# Patient Record
Sex: Female | Born: 1965 | Race: Black or African American | Hispanic: Yes | Marital: Married | Smoking: Current every day smoker
Health system: Southern US, Community
[De-identification: ages and names within clinical notes are randomized; demographics above are authoritative.]

## PROBLEM LIST (undated history)

## (undated) DIAGNOSIS — I10 Essential (primary) hypertension: Secondary | ICD-10-CM

## (undated) DIAGNOSIS — K219 Gastro-esophageal reflux disease without esophagitis: Secondary | ICD-10-CM

## (undated) DIAGNOSIS — T7840XA Allergy, unspecified, initial encounter: Secondary | ICD-10-CM

## (undated) DIAGNOSIS — J45909 Unspecified asthma, uncomplicated: Secondary | ICD-10-CM

## (undated) DIAGNOSIS — F419 Anxiety disorder, unspecified: Secondary | ICD-10-CM

## (undated) HISTORY — DX: Anxiety disorder, unspecified: F41.9

## (undated) HISTORY — DX: Allergy, unspecified, initial encounter: T78.40XA

## (undated) HISTORY — PX: APPENDECTOMY: SHX54

## (undated) HISTORY — PX: KNEE SURGERY: SHX244

## (undated) HISTORY — PX: CHOLECYSTECTOMY: SHX55

## (undated) HISTORY — DX: Unspecified asthma, uncomplicated: J45.909

---

## 2001-10-26 ENCOUNTER — Encounter: Payer: Self-pay | Admitting: Physical Medicine & Rehabilitation

## 2001-10-26 ENCOUNTER — Encounter
Admission: RE | Admit: 2001-10-26 | Discharge: 2001-10-26 | Payer: Self-pay | Admitting: Physical Medicine & Rehabilitation

## 2002-10-04 ENCOUNTER — Encounter: Payer: Self-pay | Admitting: Physical Medicine & Rehabilitation

## 2002-10-04 ENCOUNTER — Encounter
Admission: RE | Admit: 2002-10-04 | Discharge: 2002-10-04 | Payer: Self-pay | Admitting: Physical Medicine & Rehabilitation

## 2003-03-27 ENCOUNTER — Encounter
Admission: RE | Admit: 2003-03-27 | Discharge: 2003-06-25 | Payer: Self-pay | Admitting: Physical Medicine & Rehabilitation

## 2006-08-11 ENCOUNTER — Ambulatory Visit: Payer: Self-pay | Admitting: *Deleted

## 2006-10-03 ENCOUNTER — Emergency Department: Payer: Self-pay | Admitting: Emergency Medicine

## 2006-11-27 ENCOUNTER — Emergency Department: Payer: Self-pay | Admitting: Emergency Medicine

## 2006-12-17 ENCOUNTER — Ambulatory Visit: Payer: Self-pay | Admitting: Family Medicine

## 2007-09-23 IMAGING — US ABDOMEN ULTRASOUND
1 series · 17 of 25 positions shown · non-contrast
Comparison: none

REASON FOR EXAM: RUQ abd pain suspects liver or gallbladder or right
upper bowel  eval for stones
COMMENTS:

[Series 1: abdomen ultrasound · 17 of 67 slices shown]
[im 1/67]
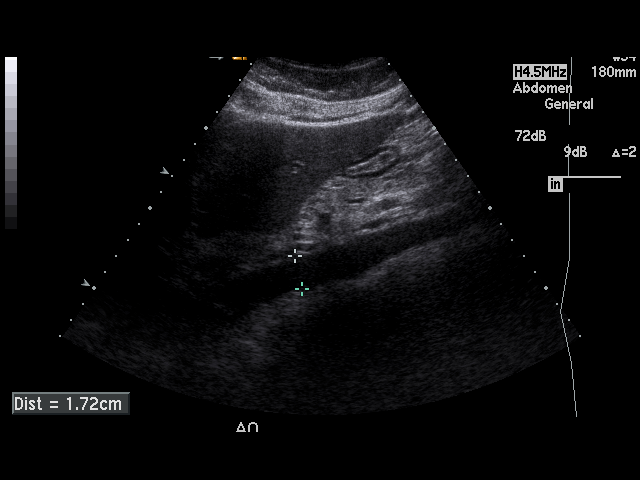
[im 6/67]
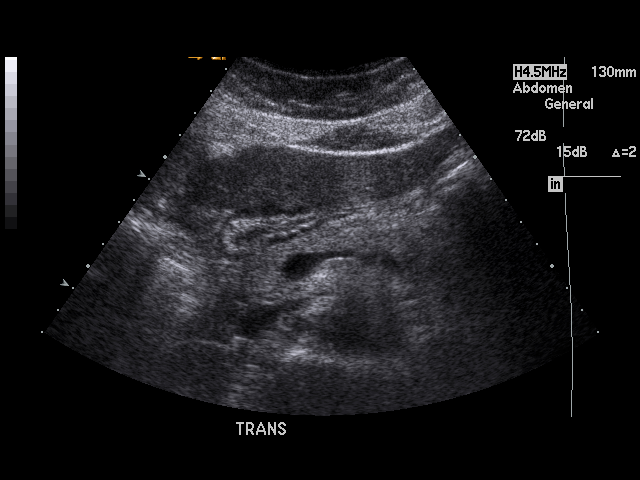
[im 9/67]
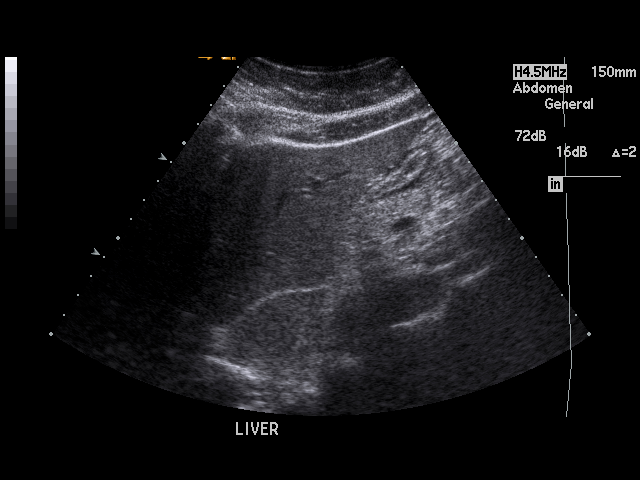
[im 14/67]
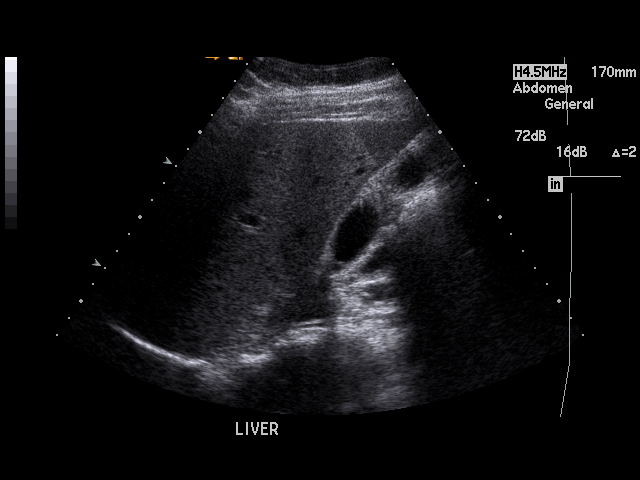
[im 17/67]
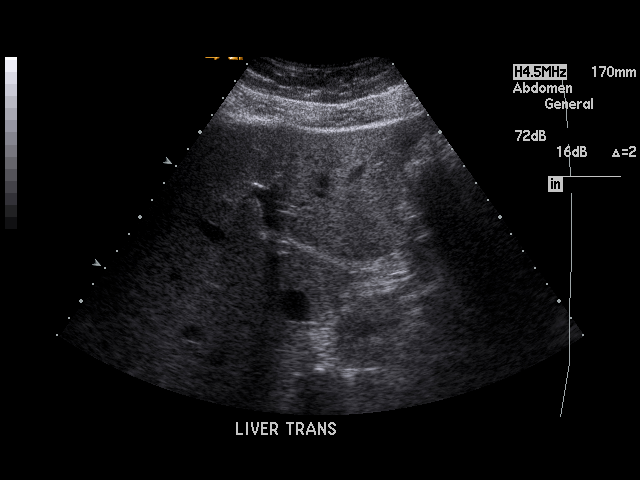
[im 23/67]
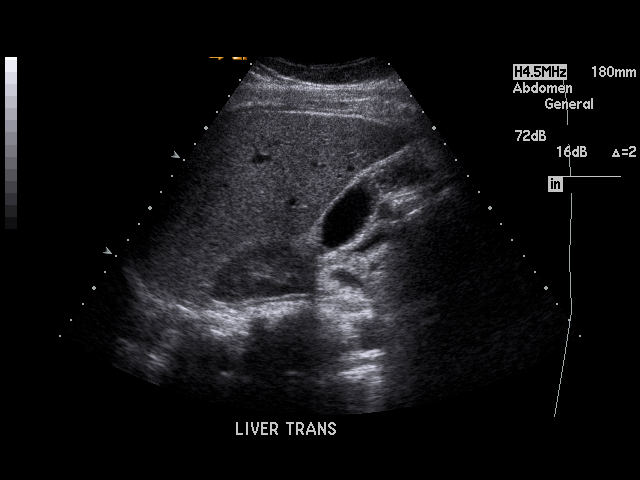
[im 25/67]
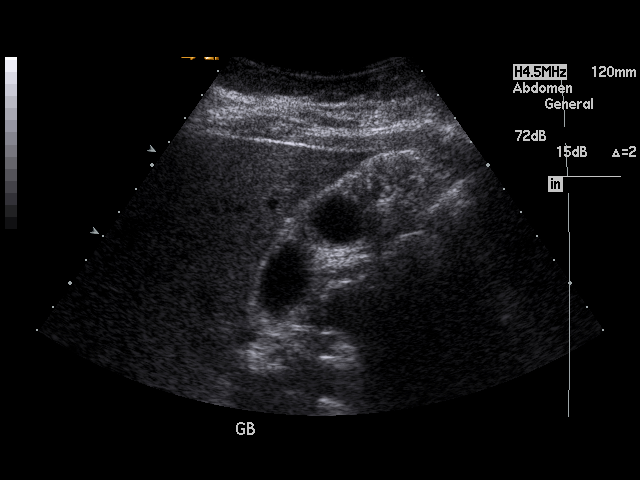
[im 31/67]
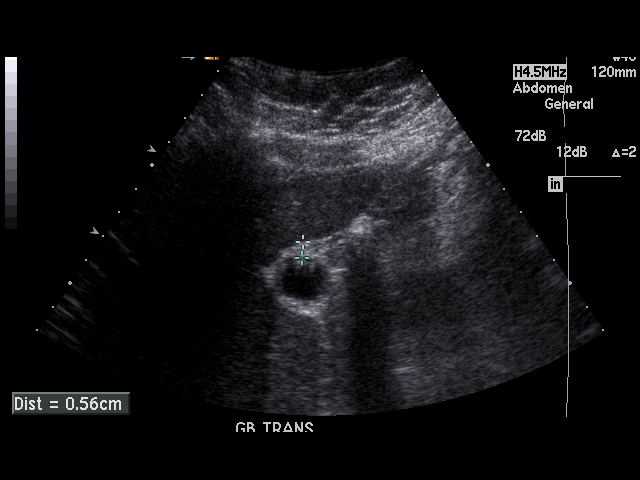
[im 34/67]
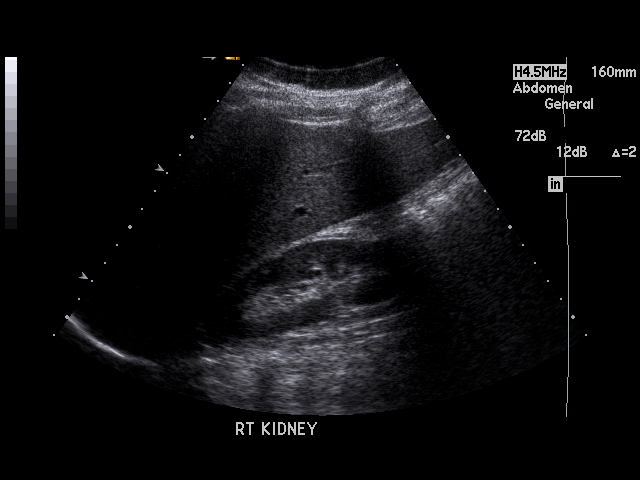
[im 36/67]
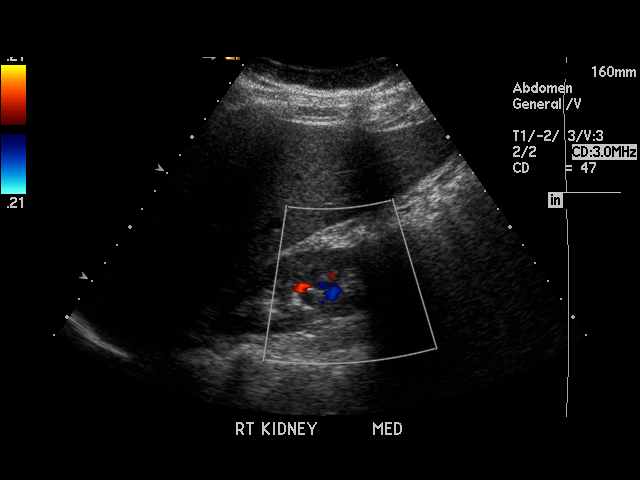
[im 42/67]
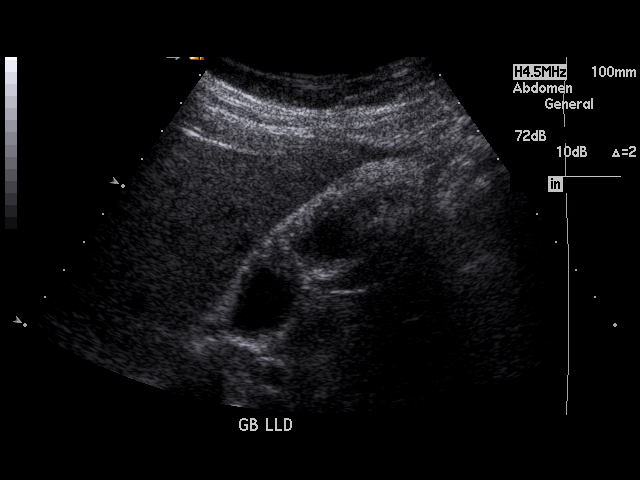
[im 45/67]
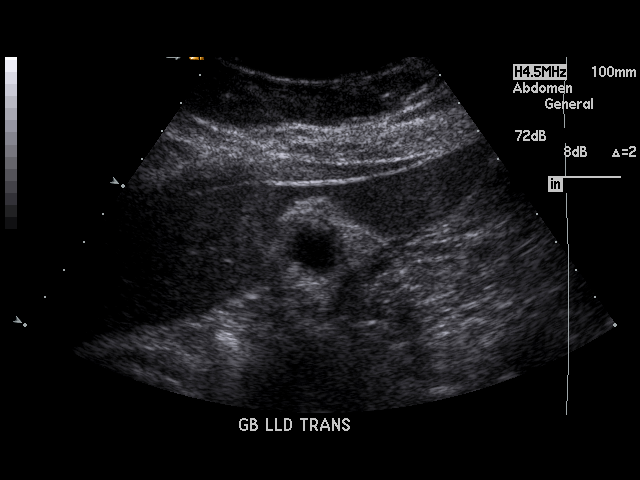
[im 50/67]
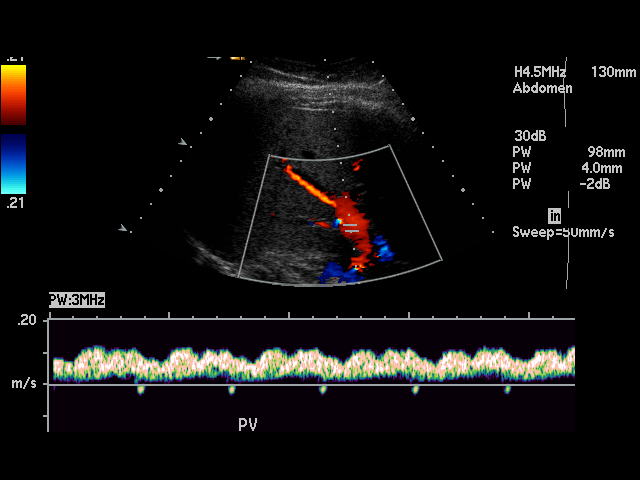
[im 53/67]
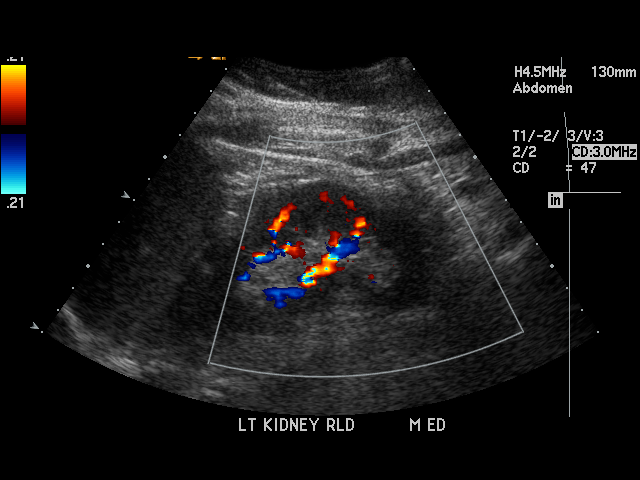
[im 58/67]
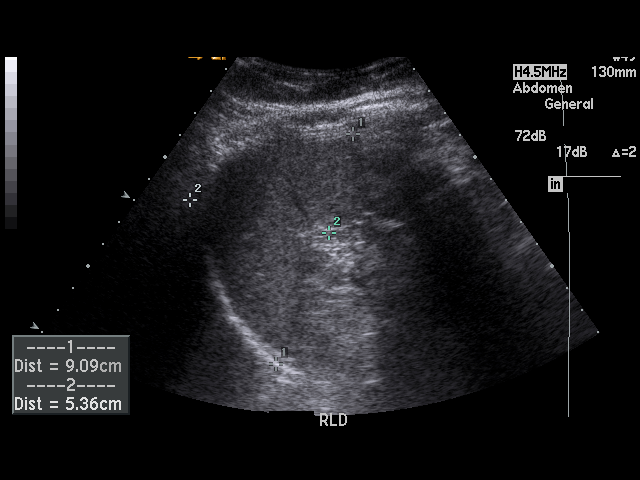
[im 61/67]
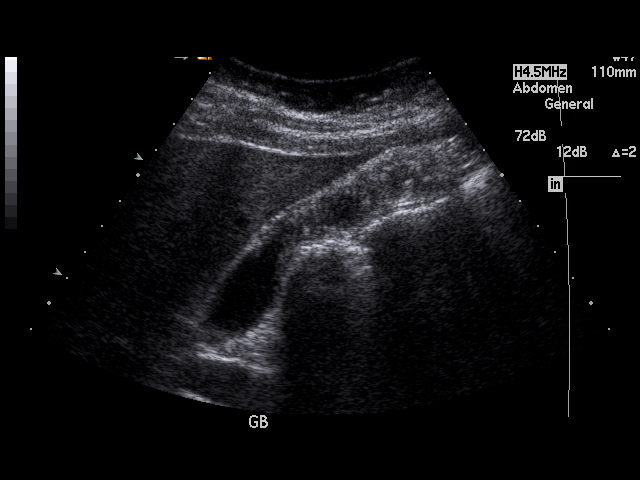
[im 67/67]
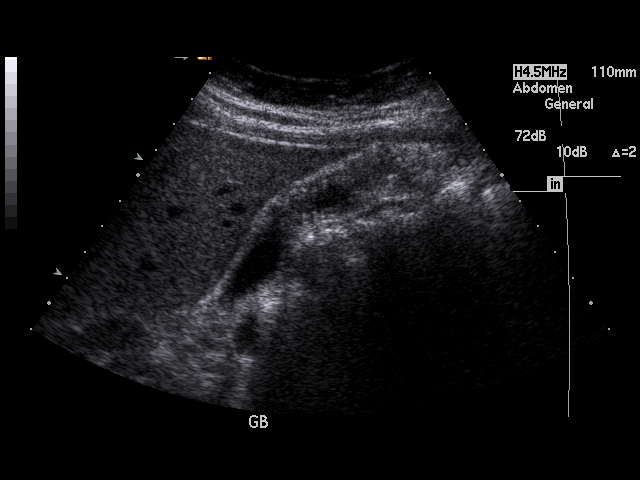

[17 of 25 positions shown; findings below may reference images not displayed]

PROCEDURE:     US  - US ABDOMEN GENERAL SURVEY  - December 17, 2006  [DATE]

RESULT:        Sonographic evaluation of the abdomen is performed in the
standard fashion.   The pancreas body is seen and appears to be normal.  The
head is partially visualized and appears to be unremarkable.   The
gallbladder shows an abnormal appearance.  No definite stones are
identified.  The gallbladder wall is abnormally thickened to 5.6 mm.  The
common bile duct diameter is distended to 7.8 mm.  The patient has a
positive sonographic Murphy sign.  The appearance is consistent with acute
cholecystitis.   The distention of the common bile duct can be consistent
with distal obstruction.  The kidneys, liver, aorta and spleen appear to be
normal.
IMPRESSION: Findings consistent with acute cholecystitis.   No gallstones identified.
A positive sonographic Murphy sign is present.  Surgical consultation is
recommended.

## 2008-09-26 ENCOUNTER — Ambulatory Visit: Payer: Self-pay | Admitting: Family Medicine

## 2009-03-20 ENCOUNTER — Emergency Department: Payer: Self-pay | Admitting: Emergency Medicine

## 2009-06-28 ENCOUNTER — Emergency Department: Payer: Self-pay | Admitting: Unknown Physician Specialty

## 2010-12-29 ENCOUNTER — Observation Stay: Payer: Self-pay | Admitting: Surgery

## 2011-01-01 LAB — PATHOLOGY REPORT

## 2014-07-19 DIAGNOSIS — G4700 Insomnia, unspecified: Secondary | ICD-10-CM | POA: Insufficient documentation

## 2014-10-16 DIAGNOSIS — Z720 Tobacco use: Secondary | ICD-10-CM | POA: Insufficient documentation

## 2015-06-03 ENCOUNTER — Encounter: Payer: Self-pay | Admitting: *Deleted

## 2015-06-03 ENCOUNTER — Emergency Department
Admission: EM | Admit: 2015-06-03 | Discharge: 2015-06-04 | Disposition: A | Discharge status: Home / Self Care | Payer: BC Managed Care – PPO | Attending: Emergency Medicine | Admitting: Emergency Medicine

## 2015-06-03 DIAGNOSIS — I10 Essential (primary) hypertension: Secondary | ICD-10-CM | POA: Insufficient documentation

## 2015-06-03 DIAGNOSIS — N390 Urinary tract infection, site not specified: Secondary | ICD-10-CM

## 2015-06-03 DIAGNOSIS — Z720 Tobacco use: Secondary | ICD-10-CM | POA: Diagnosis not present

## 2015-06-03 DIAGNOSIS — Z3202 Encounter for pregnancy test, result negative: Secondary | ICD-10-CM | POA: Insufficient documentation

## 2015-06-03 DIAGNOSIS — R109 Unspecified abdominal pain: Secondary | ICD-10-CM

## 2015-06-03 HISTORY — DX: Essential (primary) hypertension: I10

## 2015-06-03 HISTORY — DX: Gastro-esophageal reflux disease without esophagitis: K21.9

## 2015-06-03 LAB — COMPREHENSIVE METABOLIC PANEL
ALT: 23 U/L (ref 14–54)
AST: 22 U/L (ref 15–41)
Albumin: 4.6 g/dL (ref 3.5–5.0)
Alkaline Phosphatase: 86 U/L (ref 38–126)
Anion gap: 11 (ref 5–15)
BUN: 15 mg/dL (ref 6–20)
CO2: 24 mmol/L (ref 22–32)
Calcium: 9.6 mg/dL (ref 8.9–10.3)
Chloride: 105 mmol/L (ref 101–111)
Creatinine, Ser: 0.9 mg/dL (ref 0.44–1.00)
GFR calc Af Amer: >60 mL/min (ref >60)
GFR calc non Af Amer: >60 mL/min (ref >60)
GLUCOSE: 102 mg/dL — AB (ref 65–99)
POTASSIUM: 3.5 mmol/L (ref 3.5–5.1)
SODIUM: 140 mmol/L (ref 135–145)
TOTAL PROTEIN: 7.6 g/dL (ref 6.5–8.1)
Total Bilirubin: 0.4 mg/dL (ref 0.3–1.2)

## 2015-06-03 LAB — CBC WITH DIFFERENTIAL/PLATELET
BASOS ABS: 0.1 10*3/uL (ref 0–0.1)
BASOS PCT: 1 %
Eosinophils Absolute: 0.1 10*3/uL (ref 0–0.7)
Eosinophils Relative: 2 %
HCT: 37.2 % (ref 35.0–47.0)
Hemoglobin: 11.9 g/dL — ABNORMAL LOW (ref 12.0–16.0)
LYMPHS PCT: 30 %
Lymphs Abs: 2.8 10*3/uL (ref 1.0–3.6)
MCH: 27 pg (ref 26.0–34.0)
MCHC: 32 g/dL (ref 32.0–36.0)
MCV: 84.5 fL (ref 80.0–100.0)
MONO ABS: 0.6 10*3/uL (ref 0.2–0.9)
Monocytes Relative: 7 %
NEUTROS PCT: 60 %
Neutro Abs: 5.5 10*3/uL (ref 1.4–6.5)
PLATELETS: 321 10*3/uL (ref 150–440)
RBC: 4.4 MIL/uL (ref 3.80–5.20)
RDW: 15.2 % — ABNORMAL HIGH (ref 11.5–14.5)
WBC: 9.1 10*3/uL (ref 3.6–11.0)

## 2015-06-03 LAB — URINALYSIS COMPLETE WITH MICROSCOPIC (ARMC ONLY)
Bacteria, UA: NONE SEEN
Bilirubin Urine: NEGATIVE
Glucose, UA: NEGATIVE mg/dL
Ketones, ur: NEGATIVE mg/dL
Nitrite: NEGATIVE
Protein, ur: NEGATIVE mg/dL
Specific Gravity, Urine: 1.015 (ref 1.005–1.030)
pH: 5 (ref 5.0–8.0)

## 2015-06-03 LAB — POCT PREGNANCY, URINE: Preg Test, Ur: NEGATIVE

## 2015-06-03 LAB — LIPASE, BLOOD: Lipase: 64 U/L — ABNORMAL HIGH (ref 22–51)

## 2015-06-03 MED ORDER — MORPHINE SULFATE 4 MG/ML IJ SOLN
4.0000 mg | Freq: Once | INTRAMUSCULAR | Status: AC
  Administered 2015-06-03: 4 mg via INTRAVENOUS
  Filled 2015-06-03: qty 1

## 2015-06-03 MED ORDER — ONDANSETRON HCL 4 MG/2ML IJ SOLN
4.0000 mg | Freq: Once | INTRAMUSCULAR | Status: AC
  Administered 2015-06-03: 4 mg via INTRAVENOUS
  Filled 2015-06-03: qty 2

## 2015-06-03 MED ORDER — SODIUM CHLORIDE 0.9 % IV BOLUS (SEPSIS)
1000.0000 mL | Freq: Once | INTRAVENOUS | Status: AC
  Administered 2015-06-03: 1000 mL via INTRAVENOUS

## 2015-06-03 NOTE — ED Notes (Signed)
Pt reports left flank pain that began on Saturday, admits to hx of lower back pain however reports this feels different - denies any fever or urinary symptoms. Pt c/o nausea at present.

## 2015-06-03 NOTE — ED Notes (Signed)
Warm blanket given to pt per pt request

## 2015-06-03 NOTE — ED Provider Notes (Signed)
[facility] Emergency Department Provider Note  ____________________________________________  Time seen: Approximately 10:10 PM  I have reviewed the triage vital signs and the nursing notes.   HISTORY  Chief Complaint Flank Pain    HPI Joyce [name] is a 49 y.o. female with history of hypertension and GERD presents for evaluation of intermittent colicky left flank pain radiating into the left groin for the past 2-3 days, gradual onset, worsening. Current severity is 10 out of 10. No modifying factors. No nausea, vomiting, diarrhea, fevers or chills. No dysuria or hematuria. No history of malignancy, no bowel or bladder incontinence, no numbness or weakness in the legs, no fever, no history of IV drug use.   Past Medical History  Diagnosis Date  . Hypertension   . GERD (gastroesophageal reflux disease)     There are no active problems to display for this patient.   Past Surgical History  Procedure Laterality Date  . Cholecystectomy    . Appendectomy    . Cesarean section    . Knee surgery      Current Outpatient Rx  Name  Route  Sig  Dispense  Refill  . amitriptyline (ELAVIL) 50 MG tablet   Oral   Take 50 mg by mouth at bedtime.         Marland Kitchen. omeprazole (PRILOSEC OTC) 20 MG tablet   Oral   Take 20 mg by mouth daily.           Allergies Codeine; Darvon; Percocet; and Vicodin  History reviewed. No pertinent family history.  Social History History  Substance Use Topics  . Smoking status: Current Every Day Smoker -- 0.50 packs/day    Types: Cigarettes  . Smokeless tobacco: Not on file  . Alcohol Use: No    Review of Systems Constitutional: No fever/chills Eyes: No visual changes. ENT: No sore throat. Cardiovascular: Denies chest pain. Respiratory: Denies shortness of breath. Gastrointestinal: No abdominal pain.  No nausea, no vomiting.  No diarrhea.  No constipation. Genitourinary: Negative for dysuria. Musculoskeletal: Positive  for left flank pain Skin: Negative for rash. Neurological: Negative for headaches, focal weakness or numbness.  10-point ROS otherwise negative.  ____________________________________________   PHYSICAL EXAM:  VITAL SIGNS: ED Triage Vitals  Enc Vitals Group     BP 06/03/15 2157 169/79 mmHg     Pulse Rate 06/03/15 2157 80     Resp 06/03/15 2157 22     Temp 06/03/15 2157 98.2 F (36.8 C)     Temp Source 06/03/15 2157 Oral     SpO2 06/03/15 2157 100 %     Weight 06/03/15 2157 190 lb (86.183 kg)     Height 06/03/15 2157 5' 2.5" (1.588 m)     Head Cir --      Peak Flow --      Pain Score 06/03/15 2158 8     Pain Loc --      Pain Edu? --      Excl. in GC? --     Constitutional: Alert and oriented. Tearful, in distress secondary to pain. Eyes: Conjunctivae are normal. PERRL. EOMI. Head: Atraumatic. Nose: No congestion/rhinnorhea. Mouth/Throat: Mucous membranes are moist.  Oropharynx non-erythematous. Neck: No stridor.  Cardiovascular: Normal rate, regular rhythm. Grossly normal heart sounds.  Good peripheral circulation. Respiratory: Normal respiratory effort.  No retractions. Lungs CTAB. Gastrointestinal: Soft and nontender. No distention. No abdominal bruits. No CVA tenderness. Genitourinary: deferred Musculoskeletal: No lower extremity tenderness nor edema.  No joint effusions. Neurologic:  Normal speech and language. No gross focal neurologic deficits are appreciated. Speech is normal. No gait instability. Skin:  Skin is warm, dry and intact. No rash noted. Psychiatric: Mood and affect are normal. Speech and behavior are normal.  ____________________________________________   LABS (all labs ordered are listed, but only abnormal results are displayed)  Labs Reviewed  URINALYSIS COMPLETEWITH MICROSCOPIC ([facility] ONLY)  CBC WITH DIFFERENTIAL/PLATELET  COMPREHENSIVE METABOLIC PANEL  LIPASE, BLOOD  POC URINE PREG, ED  POCT PREGNANCY, URINE    ____________________________________________  EKG  none ____________________________________________  RADIOLOGY  none ____________________________________________   PROCEDURES  Procedure(s) performed: None  Critical Care performed: No  ____________________________________________   INITIAL IMPRESSION / ASSESSMENT AND PLAN / ED COURSE  Pertinent labs & imaging results that were available during my care of the patient were reviewed by me and considered in my medical decision making (see chart for details).  Joyce [name] is a 49 y.o. female with history of hypertension and GERD presents for evaluation of intermittent colicky left flank pain radiating into the left groin for the past 2-3 days, gradual onset, worsening. On arrival to the emergency department, she is tearful, in distress secondary to pain, unable to find a comfortable position on the bed. Vital signs stable, she is afebrile. Plan for screening labs, urinalysis to evaluate for evidence of kidney stone or urinary tract infection, IV fluids, pain control, antiemetics. Reassess for disposition and need for advanced imaging.  ----------------------------------------- 11:07 PM on 06/03/2015 -----------------------------------------  Care transferred to Dr. York Cerise pending workup, reassessments, disposition. ____________________________________________   FINAL CLINICAL IMPRESSION(S) / ED DIAGNOSES  Final diagnoses:  Left flank pain      Gayla Doss, MD 06/03/15 2308

## 2015-06-04 ENCOUNTER — Encounter: Payer: Self-pay | Admitting: Radiology

## 2015-06-04 ENCOUNTER — Emergency Department: Payer: BC Managed Care – PPO

## 2015-06-04 MED ORDER — IOHEXOL 240 MG/ML SOLN
25.0000 mL | Freq: Once | INTRAMUSCULAR | Status: AC | PRN
  Administered 2015-06-04: 25 mL via ORAL

## 2015-06-04 MED ORDER — CEPHALEXIN 500 MG PO CAPS: 500.0000 mg | ORAL_CAPSULE | Freq: Three times a day (TID) | ORAL | Status: DC

## 2015-06-04 MED ORDER — IOHEXOL 300 MG/ML  SOLN
100.0000 mL | Freq: Once | INTRAMUSCULAR | Status: AC | PRN
  Administered 2015-06-04: 100 mL via INTRAVENOUS

## 2015-06-04 MED ORDER — CEPHALEXIN 500 MG PO CAPS
500.0000 mg | ORAL_CAPSULE | Freq: Once | ORAL | Status: AC
  Administered 2015-06-04: 500 mg via ORAL
  Filled 2015-06-04: qty 1

## 2015-06-04 MED ORDER — MORPHINE SULFATE 4 MG/ML IJ SOLN
4.0000 mg | Freq: Once | INTRAMUSCULAR | Status: AC
  Administered 2015-06-04: 4 mg via INTRAVENOUS
  Filled 2015-06-04: qty 1

## 2015-06-04 MED ORDER — HYDROCODONE-ACETAMINOPHEN 5-325 MG PO TABS: 1.0000 | ORAL_TABLET | ORAL | Status: DC | PRN

## 2015-06-04 MED ORDER — KETOROLAC TROMETHAMINE 30 MG/ML IJ SOLN
30.0000 mg | Freq: Once | INTRAMUSCULAR | Status: AC
  Administered 2015-06-04: 30 mg via INTRAVENOUS
  Filled 2015-06-04: qty 1

## 2015-06-04 NOTE — Discharge Instructions (Signed)
As we discussed, we did not identify a specific cause for your flank pain, but we believe it may be related to a urinary tract infection.  Please take the prescribed antibiotics as written and follow-up with your regular doctor in 2 days.  Return to the emergency department if he develop new or worsening symptoms that concern you.  And then taking over-the-counter ibuprofen as instructed on the bottle as needed for pain.  Take Norco as prescribed for severe pain. Do not drink alcohol, drive or participate in any other potentially dangerous activities while taking this medication as it may make you sleepy. Do not take this medication with any other sedating medications, either prescription or over-the-counter. If you were prescribed Percocet or Vicodin, do not take these with acetaminophen (Tylenol) as it is already contained within these medications.   This medication is an opiate (or narcotic) pain medication and can be habit forming.  Use it as little as possible to achieve adequate pain control.  Do not use or use it with extreme caution if you have a history of opiate abuse or dependence.  If you are on a pain contract with your primary care doctor or a pain specialist, be sure to let them know you were prescribed this medication today from [facility].  This medication is intended for your use only - do not give any to anyone else and keep it in a secure place where nobody else, especially children, have access to it.  It will also cause or worsen constipation, so you may want to consider taking an over-the-counter stool softener while you are taking this medication.   Flank Pain Flank pain refers to pain that is located on the side of the body between the upper abdomen and the back. The pain may occur over a short period of time (acute) or may be long-term or reoccurring (chronic). It may be mild or severe. Flank pain can be caused by many things. CAUSES  Some of the  more common causes of flank pain include:  Muscle strains.   Muscle spasms.   A disease of your spine (vertebral disk disease).   A lung infection (pneumonia).   Fluid around your lungs (pulmonary edema).   A kidney infection.   Kidney stones.   A very painful skin rash caused by the chickenpox virus (shingles).   Gallbladder disease.  HOME CARE INSTRUCTIONS  Home care will depend on the cause of your pain. In general,  Rest as directed by your caregiver.  Drink enough fluids to keep your urine clear or pale yellow.  Only take over-the-counter or prescription medicines as directed by your caregiver. Some medicines may help relieve the pain.  Tell your caregiver about any changes in your pain.  Follow up with your caregiver as directed. SEEK IMMEDIATE MEDICAL CARE IF:   Your pain is not controlled with medicine.   You have new or worsening symptoms.  Your pain increases.   You have abdominal pain.   You have shortness of breath.   You have persistent nausea or vomiting.   You have swelling in your abdomen.   You feel faint or pass out.   You have blood in your urine.  You have a fever or persistent symptoms for more than 2-3 days.  You have a fever and your symptoms suddenly get worse. MAKE SURE YOU:   Understand these instructions.  Will watch your condition.  Will get help right away if you are not  doing well or get worse. Document Released: 12/31/2005 Document Revised: 08/03/2012 Document Reviewed: 06/23/2012 [facility] Patient Information 2015 [place], [place]. This information is not intended to replace advice given to you by your health care provider. Make sure you discuss any questions you have with your health care provider.  Urinary Tract Infection Urinary tract infections (UTIs) can develop anywhere along your urinary tract. Your urinary tract is your body's drainage system for removing wastes and extra water. Your urinary tract  includes two kidneys, two ureters, a bladder, and a urethra. Your kidneys are a pair of bean-shaped organs. Each kidney is about the size of your fist. They are located below your ribs, one on each side of your spine. CAUSES Infections are caused by microbes, which are microscopic organisms, including fungi, viruses, and bacteria. These organisms are so small that they can only be seen through a microscope. Bacteria are the microbes that most commonly cause UTIs. SYMPTOMS  Symptoms of UTIs may vary by age and gender of the patient and by the location of the infection. Symptoms in young women typically include a frequent and intense urge to urinate and a painful, burning feeling in the bladder or urethra during urination. Older women and men are more likely to be tired, shaky, and weak and have muscle aches and abdominal pain. A fever may mean the infection is in your kidneys. Other symptoms of a kidney infection include pain in your back or sides below the ribs, nausea, and vomiting. DIAGNOSIS To diagnose a UTI, your caregiver will ask you about your symptoms. Your caregiver also will ask to provide a urine sample. The urine sample will be tested for bacteria and white blood cells. White blood cells are made by your body to help fight infection. TREATMENT  Typically, UTIs can be treated with medication. Because most UTIs are caused by a bacterial infection, they usually can be treated with the use of antibiotics. The choice of antibiotic and length of treatment depend on your symptoms and the type of bacteria causing your infection. HOME CARE INSTRUCTIONS  If you were prescribed antibiotics, take them exactly as your caregiver instructs you. Finish the medication even if you feel better after you have only taken some of the medication.  Drink enough water and fluids to keep your urine clear or pale yellow.  Avoid caffeine, tea, and carbonated beverages. They tend to irritate your bladder.  Empty  your bladder often. Avoid holding urine for long periods of time.  Empty your bladder before and after sexual intercourse.  After a bowel movement, women should cleanse from front to back. Use each tissue only once. SEEK MEDICAL CARE IF:   You have back pain.  You develop a fever.  Your symptoms do not begin to resolve within 3 days. SEEK IMMEDIATE MEDICAL CARE IF:   You have severe back pain or lower abdominal pain.  You develop chills.  You have nausea or vomiting.  You have continued burning or discomfort with urination. MAKE SURE YOU:   Understand these instructions.  Will watch your condition.  Will get help right away if you are not doing well or get worse. Document Released: 08/19/2005 Document Revised: 05/10/2012 Document Reviewed: 12/18/2011 [facility] Patient Information 2015 [place], [place]. This information is not intended to replace advice given to you by your health care provider. Make sure you discuss any questions you have with your health care provider.

## 2015-06-04 NOTE — ED Provider Notes (Signed)
-----------------------------------------   11:00 PM on 06/03/2015 -----------------------------------------  Assuming care from Dr. Inocencio HomesGayle.  In short, Joyce [name] is a 49 y.o. female with a chief complaint of Flank Pain .  Refer to the original H&P for additional details.  The current plan of care is to follow-up the urinalysis and pursue additional workup of the flank pain as needed.  ----------------------------------------- 12:09 AM on 06/04/2015 -----------------------------------------   The urinalysis was generally unremarkable; the few white cells seen is unlikely to represent a significant pyelonephritis, and there is no significant hematuria.  As a result, I will proceed with Dr. Joya SalmGayle's plan for a contrast CT abdomen and pelvis to further evaluate the source of the patient's pain.  I am treating with morphine, Zofran, and Toradol.  ----------------------------------------- 1:37 AM on 06/04/2015 -----------------------------------------  Ct Abdomen Pelvis W Contrast  06/04/2015   CLINICAL DATA:  Intermittent colicky left flank pain radiating to the left groin for 3 days. Gradual onset and worsening.  EXAM: CT ABDOMEN AND PELVIS WITH CONTRAST  TECHNIQUE: Multidetector CT imaging of the abdomen and pelvis was performed using the standard protocol following bolus administration of intravenous contrast.  CONTRAST:  100mL OMNIPAQUE IOHEXOL 300 MG/ML SOLN, 25mL OMNIPAQUE IOHEXOL 240 MG/ML SOLN  COMPARISON:  None.  FINDINGS: Dependent atelectasis in the lung bases. Calcifications in the paraesophageal region are likely due to granulomas.  Diffuse fatty infiltration of the liver. Surgical absence of the gallbladder. No bile duct dilatation. The pancreas, spleen, kidneys, abdominal aorta, inferior vena cava, and retroperitoneal lymph nodes are unremarkable. Minimal left adrenal gland nodule measuring 10 mm diameter. Appearance is nonspecific but statistically most likely to represent a small  adenoma. Stomach, small bowel, and colon are not abnormally distended. No free air or free fluid in the abdomen. Abdominal wall musculature appears intact.  Pelvis: Appendix is surgically absent. Uterus and ovaries are not enlarged. Small amount of free fluid in the pelvis is likely physiologic. Bladder wall is not thickened. No pelvic mass or lymphadenopathy. Sigmoid colon is unremarkable. No hydroureter. Mild degenerative changes in the spine. No destructive bone lesions.  IMPRESSION: Diffuse fatty infiltration of the liver. 1 cm left adrenal gland nodule is likely an adenoma. Small amount of free fluid in the pelvis is likely physiologic. No acute process otherwise identified.   Electronically Signed   By: Burman NievesWilliam  Stevens M.D.   On: 06/04/2015 01:19   CT results are unremarkable as are her labs other than a possible mild UTI.  I reevaluated the patient in person and she states that her pain is much better now, and she is lying in the bed in no acute distress watching TV.  I gave her the results and explained that we do not have a definitive diagnosis but it may be related to a urinary tract infection.  I discussed with her my plan to treat her with Keflex and recommended that she follow up in 2 days with her primary care doctor or return immediately to the emergency department should her symptoms worsen.  She understands and agrees with the plan.  New Prescriptions   CEPHALEXIN (KEFLEX) 500 MG CAPSULE    Take 1 capsule (500 mg total) by mouth 3 (three) times daily.   HYDROCODONE-ACETAMINOPHEN (NORCO/VICODIN) 5-325 MG PER TABLET    Take 1-2 tablets by mouth every 4 (four) hours as needed for moderate pain.      Loleta Roseory Fatoumata Albaugh, MD 06/04/15 [phone]

## 2015-06-06 LAB — URINE CULTURE: SPECIAL REQUESTS: NORMAL

## 2015-06-12 DIAGNOSIS — K219 Gastro-esophageal reflux disease without esophagitis: Secondary | ICD-10-CM | POA: Insufficient documentation

## 2015-09-13 DIAGNOSIS — J4521 Mild intermittent asthma with (acute) exacerbation: Secondary | ICD-10-CM | POA: Insufficient documentation

## 2015-09-26 ENCOUNTER — Encounter: Payer: Self-pay | Admitting: Emergency Medicine

## 2015-09-26 ENCOUNTER — Emergency Department
Admission: EM | Admit: 2015-09-26 | Discharge: 2015-09-26 | Disposition: A | Discharge status: Home / Self Care | Payer: BC Managed Care – PPO | Attending: Student | Admitting: Student

## 2015-09-26 DIAGNOSIS — S29012A Strain of muscle and tendon of back wall of thorax, initial encounter: Secondary | ICD-10-CM | POA: Insufficient documentation

## 2015-09-26 DIAGNOSIS — X58XXXA Exposure to other specified factors, initial encounter: Secondary | ICD-10-CM | POA: Insufficient documentation

## 2015-09-26 DIAGNOSIS — Y998 Other external cause status: Secondary | ICD-10-CM | POA: Diagnosis not present

## 2015-09-26 DIAGNOSIS — I10 Essential (primary) hypertension: Secondary | ICD-10-CM | POA: Diagnosis not present

## 2015-09-26 DIAGNOSIS — Y9289 Other specified places as the place of occurrence of the external cause: Secondary | ICD-10-CM | POA: Diagnosis not present

## 2015-09-26 DIAGNOSIS — Z720 Tobacco use: Secondary | ICD-10-CM | POA: Diagnosis not present

## 2015-09-26 DIAGNOSIS — S29019A Strain of muscle and tendon of unspecified wall of thorax, initial encounter: Secondary | ICD-10-CM

## 2015-09-26 DIAGNOSIS — Y9389 Activity, other specified: Secondary | ICD-10-CM | POA: Insufficient documentation

## 2015-09-26 DIAGNOSIS — Z792 Long term (current) use of antibiotics: Secondary | ICD-10-CM | POA: Diagnosis not present

## 2015-09-26 DIAGNOSIS — Z79899 Other long term (current) drug therapy: Secondary | ICD-10-CM | POA: Insufficient documentation

## 2015-09-26 DIAGNOSIS — S299XXA Unspecified injury of thorax, initial encounter: Secondary | ICD-10-CM | POA: Diagnosis present

## 2015-09-26 MED ORDER — TRAMADOL HCL 50 MG PO TABS: 50.0000 mg | ORAL_TABLET | Freq: Four times a day (QID) | ORAL | Status: DC | PRN

## 2015-09-26 MED ORDER — TRAMADOL HCL 50 MG PO TABS
50.0000 mg | ORAL_TABLET | Freq: Once | ORAL | Status: AC
  Administered 2015-09-26: 50 mg via ORAL
  Filled 2015-09-26: qty 1

## 2015-09-26 MED ORDER — ORPHENADRINE CITRATE 30 MG/ML IJ SOLN
60.0000 mg | Freq: Two times a day (BID) | INTRAMUSCULAR | Status: DC
  Administered 2015-09-26: 60 mg via INTRAMUSCULAR
  Filled 2015-09-26: qty 2

## 2015-09-26 MED ORDER — NAPROXEN 500 MG PO TABS: 500.0000 mg | ORAL_TABLET | Freq: Two times a day (BID) | ORAL | Status: DC

## 2015-09-26 MED ORDER — METHOCARBAMOL 750 MG PO TABS: 1500.0000 mg | ORAL_TABLET | Freq: Four times a day (QID) | ORAL | Status: DC

## 2015-09-26 MED ORDER — KETOROLAC TROMETHAMINE 60 MG/2ML IM SOLN
60.0000 mg | Freq: Once | INTRAMUSCULAR | Status: AC
  Administered 2015-09-26: 60 mg via INTRAMUSCULAR
  Filled 2015-09-26: qty 2

## 2015-09-26 NOTE — ED Notes (Signed)
Pt states she was having spasms last night and now the pain and spasms has gotten worse pt states this has never happened before states no injury. Pt describes pain as more flank pain pt states she had her urine tested on tues

## 2015-09-26 NOTE — ED Notes (Signed)
C/o right mid back pain, radiates to right chest and across back.  Pain worse when moving right arm.  No SOB/ DOE.  Skin warm and dry.

## 2015-09-26 NOTE — ED Notes (Signed)
ED provider at bedside for eval

## 2015-09-26 NOTE — Discharge Instructions (Signed)
Thoracic Strain Thoracic strain is an injury to the muscles or tendons that attach to the upper back. A strain can be mild or severe. A mild strain may take only 1-2 weeks to heal. A severe strain involves torn muscles or tendons, so it may take 6-8 weeks to heal. HOME CARE  Rest as needed. Limit your activity as told by your doctor.  If directed, put ice on the injured area:  Put ice in a plastic bag.  Place a towel between your skin and the bag.  Leave the ice on for 20 minutes, 2-3 times per day.  Take over-the-counter and prescription medicines only as told by your doctor.  Begin doing exercises as told by your doctor or physical therapist.  Warm up before being active.  Bend your knees before you lift heavy objects.  Keep all follow-up visits as told by your doctor. This is important. GET HELP IF:  Your pain is not helped by medicine.  Your pain, bruising, or swelling is getting worse.  You have a fever. GET HELP RIGHT AWAY IF:  You have shortness of breath.  You have chest pain.  You have weakness or loss of feeling (numbness) in your legs.  You cannot control when you pee (urinate).   This information is not intended to replace advice given to you by your health care provider. Make sure you discuss any questions you have with your health care provider.   Document Released: 04/27/2008 Document Revised: 07/31/2015 Document Reviewed: 01/03/2015 [employer] Interactive Patient Education [employer]2016 [employer]

## 2015-09-26 NOTE — ED Provider Notes (Signed)
[facility] Emergency Department Provider Note  ____________________________________________  Time seen: Approximately 10:42 PM  I have reviewed the triage vital signs and the nursing notes.   HISTORY  Chief Complaint Back Pain    HPI Zonya Gudger is a 49 y.o. female 2 days of upper back pain. Patient described pain as spasmodic. Patient states she went to see her PCP 2 days ago did a urinalysis showed a she denies having any urinary tract infection or hematuria. Patient states the pain has increased a radiates across her upper thoracic area from right to left. Patient denies any loss of sensation of the upper extremities. Patient is rating her pain as a 9/10. No palliative measures for this complaint.Patient states no provocative incident causing this complaint.   Past Medical History  Diagnosis Date  . Hypertension   . GERD (gastroesophageal reflux disease)     There are no active problems to display for this patient.   Past Surgical History  Procedure Laterality Date  . Cholecystectomy    . Appendectomy    . Cesarean section    . Knee surgery      Current Outpatient Rx  Name  Route  Sig  Dispense  Refill  . amitriptyline (ELAVIL) 50 MG tablet   Oral   Take 50 mg by mouth at bedtime.         . cephALEXin (KEFLEX) 500 MG capsule   Oral   Take 1 capsule (500 mg total) by mouth 3 (three) times daily.   36 capsule   0   . HYDROcodone-acetaminophen (NORCO/VICODIN) 5-325 MG per tablet   Oral   Take 1-2 tablets by mouth every 4 (four) hours as needed for moderate pain.   15 tablet   0   . omeprazole (PRILOSEC OTC) 20 MG tablet   Oral   Take 20 mg by mouth daily.           Allergies Codeine; Darvon; Percocet; and Vicodin  No family history on file.  Social History Social History  Substance Use Topics  . Smoking status: Current Every Day Smoker -- 0.50 packs/day    Types: Cigarettes  . Smokeless tobacco: None  . Alcohol  Use: No    Review of Systems Constitutional: No fever/chills Eyes: No visual changes. ENT: No sore throat. Cardiovascular: Denies chest pain. Respiratory: Denies shortness of breath. Gastrointestinal: No abdominal pain.  No nausea, no vomiting.  No diarrhea.  No constipation. Genitourinary: Negative for dysuria. Musculoskeletal: Positive for thoracic pain Skin: Negative for rash. Neurological: Negative for headaches, focal weakness or numbness. Endocrine:Hypertension Hematological/Lymphatic: Allergic/Immunilogical: See medication list  10-point ROS otherwise negative.  ____________________________________________   PHYSICAL EXAM:  VITAL SIGNS: ED Triage Vitals  Enc Vitals Group     BP 09/26/15 2151 158/64 mmHg     Pulse Rate 09/26/15 2151 78     Resp 09/26/15 2151 16     Temp 09/26/15 2151 98.5 F (36.9 C)     Temp Source 09/26/15 2151 Oral     SpO2 09/26/15 2151 95 %     Weight 09/26/15 2151 184 lb (83.462 kg)     Height 09/26/15 2151  (1.6 m)     Head Cir --      Peak Flow --      Pain Score 09/26/15 2151 9     Pain Loc --      Pain Edu? --      Excl. in GC? --     Constitutional:  Alert and oriented. Well appearing and in no acute distress. Eyes: Conjunctivae are normal. PERRL. EOMI. Head: Atraumatic. Nose: No congestion/rhinnorhea. Mouth/Throat: Mucous membranes are moist.  Oropharynx non-erythematous. Neck: No stridor.  No cervical spine tenderness to palpation. Hematological/Lymphatic/Immunilogical: No cervical lymphadenopathy. Cardiovascular: Normal rate, regular rhythm. Grossly normal heart sounds.  Good peripheral circulation. Respiratory: Normal respiratory effort.  No retractions. Lungs CTAB. Gastrointestinal: Soft and nontender. No distention. No abdominal bruits. No CVA tenderness. Musculoskeletal: No thoracic or deformity. Tender palpation inferior scapulary muscles bilaterally. Patient has full and equal range of motion of the upper  extremities.  Neurologic:  Normal speech and language. No gross focal neurologic deficits are appreciated. No gait instability. Skin:  Skin is warm, dry and intact. No rash noted. Psychiatric: Mood and affect are normal. Speech and behavior are normal.  ____________________________________________   LABS (all labs ordered are listed, but only abnormal results are displayed)  Labs Reviewed - No data to display ____________________________________________  EKG   ____________________________________________  RADIOLOGY   ____________________________________________   PROCEDURES  Procedure(s) performed: None  Critical Care performed: No  ____________________________________________   INITIAL IMPRESSION / ASSESSMENT AND PLAN / ED COURSE  Pertinent labs & imaging results that were available during my care of the patient were reviewed by me and considered in my medical decision making (see chart for details).  Thoracic strain. Patient given Norflex, Toradol, and tramadol in the ER. Patient discharged with prescription for Robaxin, naproxen, and tramadol. Patient advised to follow-up with PCP if condition persists. ____________________________________________   FINAL CLINICAL IMPRESSION(S) / ED DIAGNOSES  Final diagnoses:  Thoracic myofascial strain, initial encounter      Joni ReiningRonald K Carah Barrientes, PA-C 09/26/15 2254  Gayla DossEryka A Gayle, MD 09/27/15 0003

## 2015-10-14 DIAGNOSIS — N3946 Mixed incontinence: Secondary | ICD-10-CM | POA: Insufficient documentation

## 2015-11-09 DIAGNOSIS — E279 Disorder of adrenal gland, unspecified: Secondary | ICD-10-CM | POA: Insufficient documentation

## 2015-11-09 DIAGNOSIS — E0590 Thyrotoxicosis, unspecified without thyrotoxic crisis or storm: Secondary | ICD-10-CM | POA: Insufficient documentation

## 2015-11-09 DIAGNOSIS — E041 Nontoxic single thyroid nodule: Secondary | ICD-10-CM | POA: Insufficient documentation

## 2016-10-01 DIAGNOSIS — M51369 Other intervertebral disc degeneration, lumbar region without mention of lumbar back pain or lower extremity pain: Secondary | ICD-10-CM | POA: Insufficient documentation

## 2016-10-01 DIAGNOSIS — M5134 Other intervertebral disc degeneration, thoracic region: Secondary | ICD-10-CM | POA: Insufficient documentation

## 2016-10-01 DIAGNOSIS — M533 Sacrococcygeal disorders, not elsewhere classified: Secondary | ICD-10-CM | POA: Insufficient documentation

## 2017-02-04 ENCOUNTER — Other Ambulatory Visit: Payer: Self-pay | Admitting: Orthopedic Surgery

## 2017-02-04 DIAGNOSIS — M25511 Pain in right shoulder: Secondary | ICD-10-CM

## 2017-02-12 ENCOUNTER — Ambulatory Visit
Admission: RE | Admit: 2017-02-12 | Discharge: 2017-02-12 | Disposition: A | Discharge status: Home / Self Care | Payer: BLUE CROSS/BLUE SHIELD | Source: Ambulatory Visit | Attending: Orthopedic Surgery | Admitting: Orthopedic Surgery

## 2017-02-12 DIAGNOSIS — M25511 Pain in right shoulder: Secondary | ICD-10-CM

## 2017-02-22 DIAGNOSIS — S46011A Strain of muscle(s) and tendon(s) of the rotator cuff of right shoulder, initial encounter: Secondary | ICD-10-CM | POA: Insufficient documentation

## 2018-04-28 ENCOUNTER — Emergency Department
Admission: EM | Admit: 2018-04-28 | Discharge: 2018-04-28 | Disposition: A | Discharge status: Home / Self Care | Payer: BLUE CROSS/BLUE SHIELD | Attending: Emergency Medicine | Admitting: Emergency Medicine

## 2018-04-28 ENCOUNTER — Encounter: Payer: Self-pay | Admitting: Emergency Medicine

## 2018-04-28 ENCOUNTER — Other Ambulatory Visit: Payer: Self-pay

## 2018-04-28 DIAGNOSIS — I10 Essential (primary) hypertension: Secondary | ICD-10-CM | POA: Insufficient documentation

## 2018-04-28 DIAGNOSIS — R0981 Nasal congestion: Secondary | ICD-10-CM | POA: Insufficient documentation

## 2018-04-28 DIAGNOSIS — F17210 Nicotine dependence, cigarettes, uncomplicated: Secondary | ICD-10-CM | POA: Insufficient documentation

## 2018-04-28 DIAGNOSIS — H1031 Unspecified acute conjunctivitis, right eye: Secondary | ICD-10-CM | POA: Insufficient documentation

## 2018-04-28 DIAGNOSIS — Z79899 Other long term (current) drug therapy: Secondary | ICD-10-CM | POA: Insufficient documentation

## 2018-04-28 MED ORDER — FLUTICASONE PROPIONATE 50 MCG/ACT NA SUSP: 2.0000 | Freq: Every day | NASAL | 2 refills | Status: DC

## 2018-04-28 MED ORDER — TOBRAMYCIN 0.3 % OP SOLN: 2.0000 [drp] | OPHTHALMIC | 0 refills | Status: DC

## 2018-04-28 NOTE — Discharge Instructions (Addendum)
Follow-up with your regular doctor if not better in 3 to 5 days.  Or you may go to the [facility].  Return to the emergency department if you are worsening.  Use the medication as prescribed.  You should not work until you have had 24 hours of eyedrops.

## 2018-04-28 NOTE — ED Provider Notes (Signed)
[facility] Emergency Department Provider Note  ____________________________________________   First MD Initiated Contact with Patient 04/28/18 1900     (approximate)  I have reviewed the triage vital signs and the nursing notes.   HISTORY  Chief Complaint Conjunctivitis; Sore Throat; and Otalgia    HPI Joyce [name] is a 52 y.o. female presents emergency department complaining of pinkeye and left-sided earache.  States she has had some nasal congestion and drainage.  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  She states most of the mucus is clear.  She denies any fever or chills.  She denies any chest pain or shortness of breath.  Past Medical History:  Diagnosis Date  . GERD (gastroesophageal reflux disease)   . Hypertension     There are no active problems to display for this patient.   Past Surgical History:  Procedure Laterality Date  . APPENDECTOMY    . CESAREAN SECTION    . CHOLECYSTECTOMY    . KNEE SURGERY      Prior to Admission medications   Medication Sig Start Date End Date Taking? Authorizing Provider  amitriptyline (ELAVIL) 50 MG tablet Take 50 mg by mouth at bedtime.    [provider]  cephALEXin (KEFLEX) 500 MG capsule Take 1 capsule (500 mg total) by mouth 3 (three) times daily. 06/04/15   Loleta Rose, MD  fluticasone (FLONASE) 50 MCG/ACT nasal spray Place 2 sprays into both nostrils daily. 04/28/18   Fisher, Roselyn Bering, PA-C  HYDROcodone-acetaminophen (NORCO/VICODIN) 5-325 MG per tablet Take 1-2 tablets by mouth every 4 (four) hours as needed for moderate pain. 06/04/15   Loleta Rose, MD  methocarbamol (ROBAXIN-750) 750 MG tablet Take 2 tablets (1,500 mg total) by mouth 4 (four) times daily. 09/26/15   Joni Reining, PA-C  naproxen (NAPROSYN) 500 MG tablet Take 1 tablet (500 mg total) by mouth 2 (two) times daily with a meal. 09/26/15   Joni Reining, PA-C   omeprazole (PRILOSEC OTC) 20 MG tablet Take 20 mg by mouth daily.    [provider]  tobramycin (TOBREX) 0.3 % ophthalmic solution Place 2 drops into the left eye every 4 (four) hours. 04/28/18   Fisher, Roselyn Bering, PA-C  traMADol (ULTRAM) 50 MG tablet Take 1 tablet (50 mg total) by mouth every 6 (six) hours as needed for moderate pain. 09/26/15   Joni Reining, PA-C    Allergies Codeine; Darvon [propoxyphene]; Percocet [oxycodone-acetaminophen]; and Vicodin [hydrocodone-acetaminophen]  History reviewed. No pertinent family history.  Social History Social History   Tobacco Use  . Smoking status: Current Every Day Smoker    Packs/day: 0.50    Types: Cigarettes  Substance Use Topics  . Alcohol use: No  . Drug use: No    Review of Systems  Constitutional: No fever/chills Eyes: No visual changes.  Positive for right eye redness ENT: No sore throat.  Positive for runny nose and congestion Respiratory: Denies cough Genitourinary: Negative for dysuria. Musculoskeletal: Negative for back pain. Skin: Negative for rash.    ____________________________________________   PHYSICAL EXAM:  VITAL SIGNS: ED Triage Vitals  Enc Vitals Group     BP 04/28/18 1832 (!) 160/64     Pulse Rate 04/28/18 1832 80     Resp 04/28/18 1832 16     Temp 04/28/18 1832 98.9 F (37.2 C)     Temp Source 04/28/18 1832 Oral     SpO2  04/28/18 1832 97 %     Weight 04/28/18 1833 169 lb (76.7 kg)     Height 04/28/18 1833 5\' 2"  (1.575 m)     Head Circumference --      Peak Flow --      Pain Score 04/28/18 1850 4     Pain Loc --      Pain Edu? --      Excl. in GC? --     Constitutional: Alert and oriented. Well appearing and in no acute distress. Eyes: Conjunctivae on the right side is injected, there is no active drainage but there is some crusting in the corner of the eye. Head: Atraumatic. Nose: No congestion/rhinnorhea.  Nasal mucosa is boggy and swollen Mouth/Throat: Mucous membranes are  moist.  Throat is normal Neck: Is supple, no lymphadenopathy is noted Cardiovascular: Normal rate, regular rhythm.  Heart sounds are normal Respiratory: Normal respiratory effort.  No retractions, lungs clear to auscultation GU: deferred Musculoskeletal: FROM all extremities, warm and well perfused Neurologic:  Normal speech and language.  Skin:  Skin is warm, dry and intact. No rash noted. Psychiatric: Mood and affect are normal. Speech and behavior are normal.  ____________________________________________   LABS (all labs ordered are listed, but only abnormal results are displayed)  Labs Reviewed - No data to display ____________________________________________   ____________________________________________  RADIOLOGY    ____________________________________________   PROCEDURES  Procedure(s) performed: No  Procedures    ____________________________________________   INITIAL IMPRESSION / ASSESSMENT AND PLAN / ED COURSE  Pertinent labs & imaging results that were available during my care of the patient were reviewed by me and considered in my medical decision making (see chart for details).  Patient is a [age] year old female presents emergency department complaining of pinkeye and nasal congestion.  On physical exam the right eye is injected and there is some crusting in the corners of the eyes.  The nasal mucosa is boggy and swollen.  The remainder the exam is unremarkable.  Explained the findings to the patient.  She was given a prescription for tobramycin ophthalmic drops and Flonase nasal spray.  She is to pick up over-the-counter Claritin or Allegra.  She was given a work note to stay out of work for at least 24 hours.  If the eye is actively draining she should not return to work until I has stopped draining.  She states she understands to comply with our instructions.  She was discharged in stable condition     As part of my medical decision making, I reviewed  the following data within the electronic MEDICAL RECORD NUMBER Nursing notes reviewed and incorporated, Notes from prior ED visits and [place] Controlled Substance Database  ____________________________________________   FINAL CLINICAL IMPRESSION(S) / ED DIAGNOSES  Final diagnoses:  Acute bacterial conjunctivitis of right eye  Nasal congestion      NEW MEDICATIONS STARTED DURING THIS VISIT:  Discharge Medication List as of 04/28/2018  7:09 PM    START taking these medications   Details  fluticasone (FLONASE) 50 MCG/ACT nasal spray Place 2 sprays into both nostrils daily., Starting Thu 04/28/2018, Print    tobramycin (TOBREX) 0.3 % ophthalmic solution Place 2 drops into the left eye every 4 (four) hours., Starting Thu 04/28/2018, Print         Note:  This document was prepared using Dragon voice recognition software and may include unintentional dictation errors.    Faythe GheeFisher, Susan W, PA-C 04/28/18 2002    Minna AntisPaduchowski, Kevin, MD 04/28/18 2227

## 2018-04-28 NOTE — ED Notes (Signed)
Left eye pink eye sx started yesterday

## 2018-04-28 NOTE — ED Triage Notes (Signed)
Pt reports that she has a sore throat, a left sided earache, and pink eye. 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

## 2018-08-29 ENCOUNTER — Emergency Department
Admission: EM | Admit: 2018-08-29 | Discharge: 2018-08-29 | Disposition: A | Discharge status: Home / Self Care | Payer: Self-pay | Attending: Student in an Organized Health Care Education/Training Program | Admitting: Student in an Organized Health Care Education/Training Program

## 2018-08-29 ENCOUNTER — Other Ambulatory Visit: Payer: Self-pay

## 2018-08-29 ENCOUNTER — Encounter: Payer: Self-pay | Admitting: Emergency Medicine

## 2018-08-29 DIAGNOSIS — I10 Essential (primary) hypertension: Secondary | ICD-10-CM | POA: Insufficient documentation

## 2018-08-29 DIAGNOSIS — N390 Urinary tract infection, site not specified: Secondary | ICD-10-CM | POA: Insufficient documentation

## 2018-08-29 DIAGNOSIS — Z79899 Other long term (current) drug therapy: Secondary | ICD-10-CM | POA: Insufficient documentation

## 2018-08-29 DIAGNOSIS — M5441 Lumbago with sciatica, right side: Secondary | ICD-10-CM | POA: Insufficient documentation

## 2018-08-29 DIAGNOSIS — F17210 Nicotine dependence, cigarettes, uncomplicated: Secondary | ICD-10-CM | POA: Insufficient documentation

## 2018-08-29 DIAGNOSIS — R030 Elevated blood-pressure reading, without diagnosis of hypertension: Secondary | ICD-10-CM

## 2018-08-29 LAB — URINALYSIS, COMPLETE (UACMP) WITH MICROSCOPIC
BILIRUBIN URINE: NEGATIVE
Bacteria, UA: NONE SEEN
Glucose, UA: NEGATIVE mg/dL
Ketones, ur: NEGATIVE mg/dL
NITRITE: NEGATIVE
PH: 5 (ref 5.0–8.0)
Protein, ur: NEGATIVE mg/dL
SPECIFIC GRAVITY, URINE: 1.017 (ref 1.005–1.030)

## 2018-08-29 LAB — POCT PREGNANCY, URINE: Preg Test, Ur: NEGATIVE

## 2018-08-29 MED ORDER — KETOROLAC TROMETHAMINE 30 MG/ML IJ SOLN
30.0000 mg | Freq: Once | INTRAMUSCULAR | Status: AC
  Administered 2018-08-29: 30 mg via INTRAMUSCULAR
  Filled 2018-08-29: qty 1

## 2018-08-29 MED ORDER — PREDNISONE 10 MG PO TABS: ORAL_TABLET | ORAL | 0 refills | Status: DC

## 2018-08-29 MED ORDER — CEPHALEXIN 500 MG PO CAPS: 500.0000 mg | ORAL_CAPSULE | Freq: Three times a day (TID) | ORAL | 0 refills | Status: DC

## 2018-08-29 MED ORDER — METHOCARBAMOL 500 MG PO TABS: 500.0000 mg | ORAL_TABLET | Freq: Four times a day (QID) | ORAL | 0 refills | Status: DC | PRN

## 2018-08-29 MED ORDER — METHOCARBAMOL 500 MG PO TABS
1000.0000 mg | ORAL_TABLET | Freq: Once | ORAL | Status: AC
  Administered 2018-08-29: 1000 mg via ORAL
  Filled 2018-08-29: qty 2

## 2018-08-29 NOTE — Discharge Instructions (Signed)
Follow-up with Dr. of your choice or [facility] acute care if any continued problems.  Today your blood pressure was elevated at 193/97.  You will need to have your blood pressure rechecked and treated with blood pressure medication if this remains high.  Begin taking Keflex 3 times a day for the next 10 days for your urinary tract infection.  Also methocarbamol is for muscle spasms 1 every 6 hours and prednisone is 3 tablets once a day for the next 5 days.  You may also take Tylenol with this medication if needed.  Use moist heat or ice to your back for comfort.  Increase fluids.  Do not drive or operate machinery while taking the muscle relaxant as it could cause drowsiness and increase your risk for injury.

## 2018-08-29 NOTE — ED Notes (Signed)
Denies recent injury/trauma. Reports waking up with pain X2-3 days ago. Patient able to ambulate with no problems

## 2018-08-29 NOTE — ED Provider Notes (Addendum)
[facility] Emergency Department Provider Note   ____________________________________________   First MD Initiated Contact with Patient 08/29/18 1418     (approximate)  I have reviewed the triage vital signs and the nursing notes.   HISTORY  Chief Complaint Back Pain   HPI Joyce [name] is a 52 y.o. female presents to the ED with complaint of low back pain for the last 3 days.  Patient states that there is been no recent injury or trauma but she has had problems with her back in the past.  She denies any urinary symptoms or hematuria.  She denies any history of stones but has a history of UTIs.  She complains that pain from her right lower back also radiates down her right lower extremity but not completely to her toes.  Patient continues to ambulate without assistance.  She has taken over-the-counter medication without any relief at home.  Her last medication was yesterday.  She rates her pain as 10/10.  Past Medical History:  Diagnosis Date  . GERD (gastroesophageal reflux disease)   . Hypertension     There are no active problems to display for this patient.   Past Surgical History:  Procedure Laterality Date  . APPENDECTOMY    . CESAREAN SECTION    . CHOLECYSTECTOMY    . KNEE SURGERY      Prior to Admission medications   Medication Sig Start Date End Date Taking? Authorizing Provider  amitriptyline (ELAVIL) 50 MG tablet Take 50 mg by mouth at bedtime.    [provider]  cephALEXin (KEFLEX) 500 MG capsule Take 1 capsule (500 mg total) by mouth 3 (three) times daily. 08/29/18   Tommi Rumps, PA-C  methocarbamol (ROBAXIN) 500 MG tablet Take 1 tablet (500 mg total) by mouth every 6 (six) hours as needed. 08/29/18   Tommi Rumps, PA-C  omeprazole (PRILOSEC OTC) 20 MG tablet Take 20 mg by mouth daily.    [provider]  predniSONE (DELTASONE) 10 MG tablet Take 3 tablets once a day for 5 days 08/29/18   Bridget Hartshorn L,  PA-C  tobramycin (TOBREX) 0.3 % ophthalmic solution Place 2 drops into the left eye every 4 (four) hours. 04/28/18   Faythe Ghee, PA-C    Allergies Codeine; Darvon [propoxyphene]; Percocet [oxycodone-acetaminophen]; and Vicodin [hydrocodone-acetaminophen]  No family history on file.  Social History Social History   Tobacco Use  . Smoking status: Current Every Day Smoker    Packs/day: 0.50    Types: Cigarettes  Substance Use Topics  . Alcohol use: No  . Drug use: No    Review of Systems Constitutional: No fever/chills Cardiovascular: Denies chest pain. Respiratory: Denies shortness of breath. Gastrointestinal: No abdominal pain.  No nausea, no vomiting.  Genitourinary: Negative for dysuria.  Negative for hematuria. Musculoskeletal: Positive for right lower back pain.  Positive for radiculopathy. Skin: Negative for rash. Neurological: Negative for headaches, focal weakness or numbness. ___________________________________________   PHYSICAL EXAM:  VITAL SIGNS: ED Triage Vitals  Enc Vitals Group     BP 08/29/18 1407 (!) 193/97     Pulse Rate 08/29/18 1407 81     Resp 08/29/18 1407 18     Temp 08/29/18 1407 98.4 F (36.9 C)     Temp Source 08/29/18 1407 Oral     SpO2 08/29/18 1407 96 %     Weight 08/29/18 1408 174 lb (78.9 kg)     Height 08/29/18 1408 5\' 2"  (1.575 m)  Head Circumference --      Peak Flow --      Pain Score 08/29/18 1407 10     Pain Loc --      Pain Edu? --      Excl. in GC? --    Constitutional: Alert and oriented. Well appearing and in no acute distress. Eyes: Conjunctivae are normal.  Head: Atraumatic. Neck: No stridor.   Cardiovascular: Normal rate, regular rhythm. Grossly normal heart sounds.  Good peripheral circulation. Respiratory: Normal respiratory effort.  No retractions. Lungs CTAB. Gastrointestinal: Soft and nontender. No distention. No CVA tenderness. Musculoskeletal: There is no point tenderness on palpation of the thoracic  or lumbar spine.  There is however moderate tenderness on palpation of the right SI joint area and soft tissue surrounding that area.  Straight leg raises are negative.  Good muscle strength bilaterally.  Patient is able to ambulate without any assistance. Neurologic:  Normal speech and language. No gross focal neurologic deficits are appreciated.  Reflexes are 2+ bilaterally no gait instability. Skin:  Skin is warm, dry and intact.  No ecchymosis or abrasions are seen. Psychiatric: Mood and affect are normal. Speech and behavior are normal.  ____________________________________________   LABS (all labs ordered are listed, but only abnormal results are displayed)  Labs Reviewed  URINALYSIS, COMPLETE (UACMP) WITH MICROSCOPIC - Abnormal; Notable for the following components:      Result Value   Color, Urine YELLOW (*)    APPearance CLOUDY (*)    Hgb urine dipstick SMALL (*)    Leukocytes, UA MODERATE (*)    All other components within normal limits  URINE CULTURE  POC URINE PREG, ED  POCT PREGNANCY, URINE    PROCEDURES  Procedure(s) performed: None  Procedures  Critical Care performed: No  ____________________________________________   INITIAL IMPRESSION / ASSESSMENT AND PLAN / ED COURSE  As part of my medical decision making, I reviewed the following data within the electronic MEDICAL RECORD NUMBER Notes from prior ED visits and [place] Controlled Substance Database  Patient presents to the ED with complaint of low back pain mostly on the right.  She does endorse some radiation down her right leg but she continues to ambulate without assistance.  She is unaware of any urinary symptoms but has had UTIs in the past.  Physical exam is significant for right-sided SI joint tenderness.  Urinalysis revealed WBCs suggestive of a UTI.  Patient was given Robaxin and Toradol while in the ED waiting for test results.  Patient was discharged with a prescription for Keflex 500 mg 3 times daily for 10  days along with continued Robaxin and a prescription for prednisone.  Patient is to follow-up with her PCP or [facility] acute care if any continued problems.  She also was made aware that her blood pressure was elevated and that this would need to be evaluated again as well.  ____________________________________________   FINAL CLINICAL IMPRESSION(S) / ED DIAGNOSES  Final diagnoses:  Acute right-sided low back pain with right-sided sciatica  Acute urinary tract infection  Elevated blood pressure reading     ED Discharge Orders         Ordered    predniSONE (DELTASONE) 10 MG tablet     08/29/18 1633    methocarbamol (ROBAXIN) 500 MG tablet  Every 6 hours PRN     08/29/18 1633    cephALEXin (KEFLEX) 500 MG capsule  3 times daily,   Status:  Discontinued     08/29/18 1633  cephALEXin (KEFLEX) 500 MG capsule  3 times daily     08/29/18 1634           Note:  This document was prepared using Dragon voice recognition software and may include unintentional dictation errors.    Tommi Rumps, PA-C 08/29/18 1712    Tommi Rumps, PA-C 08/29/18 1713    Willy Eddy, MD 08/30/18 [phone]

## 2018-08-29 NOTE — ED Triage Notes (Signed)
Low back pain radiating to R leg x 3 days. Denies fall or injury on onset.

## 2018-08-30 LAB — URINE CULTURE: SPECIAL REQUESTS: NORMAL

## 2019-05-27 ENCOUNTER — Emergency Department
Admission: EM | Admit: 2019-05-27 | Discharge: 2019-05-27 | Disposition: A | Discharge status: Home / Self Care | Payer: Self-pay | Attending: Emergency Medicine | Admitting: Emergency Medicine

## 2019-05-27 ENCOUNTER — Encounter: Payer: Self-pay | Admitting: Emergency Medicine

## 2019-05-27 ENCOUNTER — Other Ambulatory Visit: Payer: Self-pay

## 2019-05-27 DIAGNOSIS — Z760 Encounter for issue of repeat prescription: Secondary | ICD-10-CM | POA: Insufficient documentation

## 2019-05-27 DIAGNOSIS — F17210 Nicotine dependence, cigarettes, uncomplicated: Secondary | ICD-10-CM | POA: Insufficient documentation

## 2019-05-27 DIAGNOSIS — J0100 Acute maxillary sinusitis, unspecified: Secondary | ICD-10-CM | POA: Insufficient documentation

## 2019-05-27 DIAGNOSIS — Z79899 Other long term (current) drug therapy: Secondary | ICD-10-CM | POA: Insufficient documentation

## 2019-05-27 DIAGNOSIS — I10 Essential (primary) hypertension: Secondary | ICD-10-CM | POA: Insufficient documentation

## 2019-05-27 MED ORDER — FLUTICASONE PROPIONATE 50 MCG/ACT NA SUSP: 1.0000 | Freq: Two times a day (BID) | NASAL | 0 refills | Status: DC

## 2019-05-27 MED ORDER — CETIRIZINE HCL 10 MG PO TABS: 10.0000 mg | ORAL_TABLET | Freq: Every day | ORAL | 0 refills | Status: DC

## 2019-05-27 MED ORDER — AMOXICILLIN-POT CLAVULANATE 875-125 MG PO TABS: 1.0000 | ORAL_TABLET | Freq: Two times a day (BID) | ORAL | 0 refills | Status: DC

## 2019-05-27 MED ORDER — AMOXICILLIN-POT CLAVULANATE 875-125 MG PO TABS
1.0000 | ORAL_TABLET | Freq: Once | ORAL | Status: AC
  Administered 2019-05-27: 1 via ORAL
  Filled 2019-05-27 (×2): qty 1

## 2019-05-27 MED ORDER — HYDROCHLOROTHIAZIDE 12.5 MG PO CAPS: 12.5000 mg | ORAL_CAPSULE | Freq: Every day | ORAL | 11 refills | Status: DC

## 2019-05-27 NOTE — ED Notes (Signed)
Right upper dental pain x 3 days, pt c/o swelling to face as well, pt states she does have a bad tooth on that side where the pain is. Pt does not have a dentist at this time.

## 2019-05-27 NOTE — ED Triage Notes (Addendum)
Upper R dental pain x 2 days. States is tender to touch but slightly swollen. Regarding blood pressure, states has been out of blood pressure medication and needs prescription.

## 2019-05-27 NOTE — ED Provider Notes (Signed)
[facility] Emergency Department Provider Note  ____________________________________________  Time seen: Approximately 3:01 PM  I have reviewed the triage vital signs and the nursing notes.   HISTORY  Chief Complaint Dental Pain    HPI Joyce [name] is a 53 y.o. female who presents the emergency department complaining of pain and swelling to the right zygomatic region of the face.  Patient is unsure whether this is sinuses or dental infection.  Patient has no dental pain but does have pressure in her sinuses.  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  Patient denies any fevers or chills.  No difficulty breathing or swallowing.  No other complaints at this time.  No medications prior to arrival.  Patient has a history of GERD and hypertension.  She is requesting refill of her medication for hypertension.  Patient reports that she was on HCTZ, 12.5 mg.         Past Medical History:  Diagnosis Date  . GERD (gastroesophageal reflux disease)   . Hypertension     There are no active problems to display for this patient.   Past Surgical History:  Procedure Laterality Date  . APPENDECTOMY    . CESAREAN SECTION    . CHOLECYSTECTOMY    . KNEE SURGERY      Prior to Admission medications   Medication Sig Start Date End Date Taking? Authorizing Provider  amitriptyline (ELAVIL) 50 MG tablet Take 50 mg by mouth at bedtime.    [provider]  amoxicillin-clavulanate (AUGMENTIN) 875-125 MG tablet Take 1 tablet by mouth 2 (two) times daily. 05/27/19   Tecla Mailloux, Charline Bills, PA-C  cephALEXin (KEFLEX) 500 MG capsule Take 1 capsule (500 mg total) by mouth 3 (three) times daily. 08/29/18   Johnn Hai, PA-C  cetirizine (ZYRTEC) 10 MG tablet Take 1 tablet (10 mg total) by mouth daily. 05/27/19   Renezmae Canlas, Charline Bills, PA-C  fluticasone (FLONASE) 50  MCG/ACT nasal spray Place 1 spray into both nostrils 2 (two) times daily. 05/27/19   Soha Thorup, Charline Bills, PA-C  hydrochlorothiazide (MICROZIDE) 12.5 MG capsule Take 1 capsule (12.5 mg total) by mouth daily. 05/27/19 05/26/20  Hadlyn Amero, Charline Bills, PA-C  methocarbamol (ROBAXIN) 500 MG tablet Take 1 tablet (500 mg total) by mouth every 6 (six) hours as needed. 08/29/18   Johnn Hai, PA-C  omeprazole (PRILOSEC OTC) 20 MG tablet Take 20 mg by mouth daily.    [provider]  predniSONE (DELTASONE) 10 MG tablet Take 3 tablets once a day for 5 days 08/29/18   Letitia Neri L, PA-C  tobramycin (TOBREX) 0.3 % ophthalmic solution Place 2 drops into the left eye every 4 (four) hours. 04/28/18   Versie Starks, PA-C    Allergies Codeine, Darvon [propoxyphene], Percocet [oxycodone-acetaminophen], and Vicodin [hydrocodone-acetaminophen]  No family history on file.  Social History Social History   Tobacco Use  . Smoking status: Current Every Day Smoker    Packs/day: 0.50    Types: Cigarettes  Substance Use Topics  . Alcohol use: No  . Drug use: No     Review of Systems  Constitutional: No fever/chills Eyes: No visual changes. No discharge ENT: Positive for right upper dental/right facial pain and swelling Cardiovascular: no chest pain. Respiratory: no cough. No SOB. Gastrointestinal: No abdominal pain.  No nausea, no vomiting.  No diarrhea.  No constipation. Musculoskeletal: Negative  for musculoskeletal pain. Skin: Negative for rash, abrasions, lacerations, ecchymosis. Neurological: Negative for headaches, focal weakness or numbness. 10-point ROS otherwise negative.  ____________________________________________   PHYSICAL EXAM:  VITAL SIGNS: ED Triage Vitals  Enc Vitals Group     BP 05/27/19 1305 (!) 181/92     Pulse Rate 05/27/19 1305 97     Resp 05/27/19 1305 20     Temp 05/27/19 1305 98.8 F (37.1 C)     Temp Source 05/27/19 1305 Oral     SpO2 05/27/19 1305 99 %      Weight 05/27/19 1307 165 lb (74.8 kg)     Height 05/27/19 1307 5' 2.5" (1.588 m)     Head Circumference --      Peak Flow --      Pain Score 05/27/19 1307 0     Pain Loc --      Pain Edu? --      Excl. in GC? --      Constitutional: Alert and oriented. Well appearing and in no acute distress. Eyes: Conjunctivae are normal. PERRL. EOMI. Head: Atraumatic. ENT:      Ears:       Nose: No congestion/rhinnorhea.  Turbinates are erythematous and edematous on right.  Patient is tender to percussion over the maxillary sinuses.  No other tenderness to percussion over the sinuses.  Edema noted over the right maxillary sinus.  No crepitus.  No subcutaneous emphysema.      Mouth/Throat: Mucous membranes are moist.  Multiple dental erosions appreciated.  No erythema or edema in the intraoral cavity. Neck: No stridor.   Hematological/Lymphatic/Immunilogical: No cervical lymphadenopathy. Cardiovascular: Normal rate, regular rhythm. Normal S1 and S2.  Good peripheral circulation. Respiratory: Normal respiratory effort without tachypnea or retractions. Lungs CTAB. Good air entry to the bases with no decreased or absent breath sounds. Musculoskeletal: Full range of motion to all extremities. No gross deformities appreciated. Neurologic:  Normal speech and language. No gross focal neurologic deficits are appreciated.  Skin:  Skin is warm, dry and intact. No rash noted. Psychiatric: Mood and affect are normal. Speech and behavior are normal. Patient exhibits appropriate insight and judgement.   ____________________________________________   LABS (all labs ordered are listed, but only abnormal results are displayed)  Labs Reviewed - No data to display ____________________________________________  EKG   ____________________________________________  RADIOLOGY   No results found.  ____________________________________________    PROCEDURES  Procedure(s) performed:     Procedures    Medications  amoxicillin-clavulanate (AUGMENTIN) 875-125 MG per tablet 1 tablet (1 tablet Oral Given 05/27/19 1513)     ____________________________________________   INITIAL IMPRESSION / ASSESSMENT AND PLAN / ED COURSE  Pertinent labs & imaging results that were available during my care of the patient were reviewed by me and considered in my medical decision making (see chart for details).  Review of the [place] CSRS was performed in accordance of the [facility] prior to dispensing any controlled drugs.           Patient's diagnosis is consistent with sinusitis.  Patient presented to the emergency department complaining of swelling to the right zygomatic region.  Patient has sinus pressure but no dental pain.  On exam, findings are most consistent with sinusitis versus dental abscess.  Patient will be treated with Augmentin, Flonase, Zyrtec and I will refill the patient's blood pressure medication.  Follow-up with primary care as needed..  Patient is given ED precautions to return to the ED for any worsening or new symptoms.  ____________________________________________  FINAL CLINICAL IMPRESSION(S) / ED DIAGNOSES  Final diagnoses:  Acute maxillary sinusitis, recurrence not specified      NEW MEDICATIONS STARTED DURING THIS VISIT:  ED Discharge Orders         Ordered    amoxicillin-clavulanate (AUGMENTIN) 875-125 MG tablet  2 times daily     05/27/19 1508    fluticasone (FLONASE) 50 MCG/ACT nasal spray  2 times daily     05/27/19 1508    cetirizine (ZYRTEC) 10 MG tablet  Daily     05/27/19 1508    hydrochlorothiazide (MICROZIDE) 12.5 MG capsule  Daily     05/27/19 1508              This chart was dictated using voice recognition software/Dragon. Despite best efforts to proofread, errors can occur which can change the meaning. Any change was purely unintentional.    Racheal PatchesCuthriell, Cheril Slattery D, PA-C 05/27/19 1514    Sharyn CreamerQuale, Mark, MD 05/27/19  1534

## 2020-06-18 ENCOUNTER — Ambulatory Visit: Payer: Self-pay | Attending: Internal Medicine

## 2020-06-18 DIAGNOSIS — Z23 Encounter for immunization: Secondary | ICD-10-CM

## 2020-06-18 NOTE — Progress Notes (Signed)
   Covid-19 Vaccination Clinic  Name:  Kaedance Magos    MRN: [id number] DOB: 1966-10-14  06/18/2020  Ms. Tarver was observed post Covid-19 immunization for 15 minutes without incident. She was provided with Vaccine Information Sheet and instruction to access the V-Safe system.   Ms. Taplin was instructed to call 911 with any severe reactions post vaccine: Marland Kitchen Difficulty breathing  . Swelling of face and throat  . A fast heartbeat  . A bad rash all over body  . Dizziness and weakness   Immunizations Administered    Name Date Dose VIS Date Route   Pfizer COVID-19 Vaccine 06/18/2020  4:03 PM 0.3 mL 01/17/2019 Intramuscular   Manufacturer: [employer], [employer]   Lot: [id number]   NDC: [address]-6

## 2020-07-08 ENCOUNTER — Ambulatory Visit: Payer: Self-pay | Attending: Internal Medicine

## 2020-07-08 DIAGNOSIS — Z23 Encounter for immunization: Secondary | ICD-10-CM

## 2020-07-08 NOTE — Progress Notes (Signed)
° °  Covid-19 Vaccination Clinic  Name:  Jin Shockley    MRN: [id number] DOB: [birth year]  07/08/2020  Ms. Distel was observed post Covid-19 immunization for 15 minutes without incident. She was provided with Vaccine Information Sheet and instruction to access the V-Safe system.   Ms. Dimitroff was instructed to call 911 with any severe reactions post vaccine:  Difficulty breathing   Swelling of face and throat   A fast heartbeat   A bad rash all over body   Dizziness and weakness   Immunizations Administered    Name Date Dose VIS Date Route   Pfizer COVID-19 Vaccine 07/08/2020  3:44 PM 0.3 mL 01/17/2019 Intramuscular   Manufacturer: [employer], [employer]   Lot: [id number]   NDC: [address]-8

## 2023-01-18 DIAGNOSIS — J45909 Unspecified asthma, uncomplicated: Secondary | ICD-10-CM | POA: Insufficient documentation

## 2023-10-07 DIAGNOSIS — M7918 Myalgia, other site: Secondary | ICD-10-CM | POA: Insufficient documentation

## 2023-10-14 ENCOUNTER — Encounter: Payer: Self-pay | Admitting: Obstetrics

## 2023-10-14 ENCOUNTER — Ambulatory Visit: Payer: BC Managed Care – PPO | Admitting: Obstetrics

## 2023-10-14 ENCOUNTER — Other Ambulatory Visit (HOSPITAL_COMMUNITY)
Admission: RE | Admit: 2023-10-14 | Discharge: 2023-10-14 | Disposition: A | Discharge status: Home / Self Care | Payer: BC Managed Care – PPO | Source: Ambulatory Visit | Attending: Obstetrics | Admitting: Obstetrics

## 2023-10-14 VITALS — BP 155/77 | HR 102 | Ht 62.5 in | Wt 199.0 lb

## 2023-10-14 DIAGNOSIS — Z124 Encounter for screening for malignant neoplasm of cervix: Secondary | ICD-10-CM

## 2023-10-14 DIAGNOSIS — N950 Postmenopausal bleeding: Secondary | ICD-10-CM | POA: Insufficient documentation

## 2023-10-14 DIAGNOSIS — N858 Other specified noninflammatory disorders of uterus: Secondary | ICD-10-CM | POA: Diagnosis not present

## 2023-10-14 NOTE — Progress Notes (Signed)
NEW PATIENT GYNECOLOGY PROGRESS NOTE  Subjective:  PCP: Patient, No Pcp Per  Patient ID: Joyce [name], female    DOB: [birth year], 57 y.o.   MRN: [id number]  HPI  Patient is a 57 y.o. [id number] female who presents for PMB, started Oct '24 and lasted about 2 days, was light and brown. Second episode on 11/13, had spotting for 1-2 days. Post-menopausal at 57yo, no bleeding for the past 17yrs until now. Last pap-01/25/2014 and reports normal, no hx of abnormal paps or biopsies. Periods prior to menopause were regular monthly, lasted about 5-6 days, flow was heavy. No history of uterine surgery or instrumentation. Denies hx of STI. Denies hx of uterine cancer in her family.   Past Medical History:  Diagnosis Date   GERD (gastroesophageal reflux disease)    Hypertension    Past Surgical History:  Procedure Laterality Date   APPENDECTOMY     CESAREAN SECTION     CHOLECYSTECTOMY     KNEE SURGERY     OB History     Gravida  4   Para  4   Term  3   Preterm  1   AB      Living  4      SAB      IAB      Ectopic      Multiple      Live Births             History reviewed. No pertinent family history.   Social History   Socioeconomic History   Marital status: Married    Spouse name: Not on file   Number of children: Not on file   Years of education: Not on file   Highest education level: Not on file  Occupational History   Not on file  Tobacco Use   Smoking status: Every Day    Current packs/day: 0.50    Types: Cigarettes   Smokeless tobacco: Not on file  Vaping Use   Vaping status: Never Used  Substance and Sexual Activity   Alcohol use: No   Drug use: No   Sexual activity: Not Currently  Other Topics Concern   Not on file  Social History Narrative   Not on file   Social Determinants of Health   Financial Resource Strain: Not on file  Food Insecurity: Not on file  Transportation Needs: Not on file  Physical Activity: Not on file  Stress: Not on  file  Social Connections: Not on file  Intimate Partner Violence: Not on file   Current Outpatient Medications on File Prior to Visit  Medication Sig Dispense Refill   albuterol (VENTOLIN HFA) 108 (90 Base) MCG/ACT inhaler Inhale into the lungs.     amitriptyline (ELAVIL) 100 MG tablet Take 1 tablet every day by oral route.     amitriptyline (ELAVIL) 50 MG tablet Take 50 mg by mouth at bedtime.     amLODipine-olmesartan (AZOR) 5-20 MG tablet Take 1 tablet by mouth daily.     celecoxib (CELEBREX) 200 MG capsule 1 po bid prn pain     gabapentin (NEURONTIN) 300 MG capsule 1 po qam, 2 po qhs     LORazepam (ATIVAN) 1 MG tablet 1/2 to 1 tab po daily prn anxiety     amoxicillin-clavulanate (AUGMENTIN) 875-125 MG tablet Take 1 tablet by mouth 2 (two) times daily. 14 tablet 0   cephALEXin (KEFLEX) 500 MG capsule Take 1 capsule (500 mg total) by mouth 3 (three) times  daily. 30 capsule 0   cetirizine (ZYRTEC) 10 MG tablet Take 1 tablet (10 mg total) by mouth daily. 30 tablet 0   fluticasone (FLONASE) 50 MCG/ACT nasal spray Place 1 spray into both nostrils 2 (two) times daily. 16 g 0   hydrochlorothiazide (MICROZIDE) 12.5 MG capsule Take 1 capsule (12.5 mg total) by mouth daily. 30 capsule 11   methocarbamol (ROBAXIN) 500 MG tablet Take 1 tablet (500 mg total) by mouth every 6 (six) hours as needed. 15 tablet 0   omeprazole (PRILOSEC OTC) 20 MG tablet Take 20 mg by mouth daily.     predniSONE (DELTASONE) 10 MG tablet Take 3 tablets once a day for 5 days 15 tablet 0   tobramycin (TOBREX) 0.3 % ophthalmic solution Place 2 drops into the left eye every 4 (four) hours. 5 mL 0   No current facility-administered medications on file prior to visit.   Allergies  Allergen Reactions   Codeine Hives and Itching   Darvon [Propoxyphene] Hives and Itching   Percocet [Oxycodone-Acetaminophen] Hives and Itching   Vicodin [Hydrocodone-Acetaminophen] Hives and Itching   The following portions of the patient's  history were reviewed and updated as appropriate: allergies, current medications, past family history, past medical history, past social history, past surgical history, and problem list.  Review of Systems Pertinent items are noted in HPI.   Objective:   Blood pressure (!) 155/77, pulse (!) 102, height 5' 2.5" (1.588 m), weight 199 lb (90.3 kg). Body mass index is 35.82 kg/m.  General appearance: alert, cooperative, and moderately obese Abdomen: soft, non-tender; bowel sounds normal; no masses,  no organomegaly Pelvic: cervix normal in appearance, external genitalia normal, no adnexal masses or tenderness, no cervical motion tenderness, uterus normal size, shape, and consistency, and vagina normal without discharge Extremities: extremities normal, atraumatic, no cyanosis or edema Neurologic: Grossly normal  Endometrial Biopsy Procedure Note The patient was positioned on the exam table in the dorsal lithotomy position. Bimanual exam confirmed uterine position and size. A Graves speculum was placed into the vagina. A single toothed tenaculum was placed onto the anterior lip of the cervix. The pipette was placed into the endocervical canal and advanced to the uterine fundus. Using a piston like technique, with vacuum created by withdrawing the stylus, the endometrial specimen was obtained and transferred to the biopsy container. Minimal bleeding encountered. The procedure was well tolerated.   Uterine Position: mid   Uterine Length: 7 cm  Uterine Specimen: scant  Assessment/Plan:   1. Postmenopausal bleeding   2. Cervical cancer screening     Discussed etiologies of postmenopausal bleeding, concern about precancerous/hyperplasia or cancerous etiology (5 to 10% percent of cases). Also discussed role of unopposed estrogen exposure in leading to thickened or proliferative endometrium; and its possible correlation with endometrial hyperplasia/carcinoma.  Discussed that obesity is linked to  endometrial pathology given that adipose cells produce extra estrogen (estrone) which can cause the endometrium to have a significant amount of estrogen exposure.  However, she was reassured that endometrial atrophy and endometrial polyps are the most common causes of postmenopausal bleeding.  Uterine bleeding in postmenopausal women is usually light and self-limited. Exclusion of cancer is the main objective; therefore, treatment is usually unnecessary once cancer has been excluded.  The primary goal in the diagnostic evaluation of postmenopausal women with uterine bleeding is to exclude malignancy; this can include endometrial biopsy and pelvic ultrasound, which have been ordered today. Pap obtained. Will notify patient of results and next steps. All questions  answered today.    Julieanne Manson, DO [facility]

## 2023-10-15 LAB — SURGICAL PATHOLOGY

## 2023-10-19 LAB — CYTOLOGY - PAP
Comment: NEGATIVE
Diagnosis: NEGATIVE
High risk HPV: NEGATIVE

## 2023-11-12 ENCOUNTER — Other Ambulatory Visit: Payer: BC Managed Care – PPO

## 2023-12-22 ENCOUNTER — Ambulatory Visit: Payer: 59

## 2023-12-22 DIAGNOSIS — N950 Postmenopausal bleeding: Secondary | ICD-10-CM

## 2023-12-28 ENCOUNTER — Encounter: Payer: Self-pay | Admitting: Obstetrics

## 2024-03-16 DIAGNOSIS — M4804 Spinal stenosis, thoracic region: Secondary | ICD-10-CM | POA: Insufficient documentation

## 2024-09-06 ENCOUNTER — Encounter: Payer: Self-pay | Admitting: Pediatrics

## 2024-09-06 ENCOUNTER — Ambulatory Visit: Admitting: Pediatrics

## 2024-09-06 VITALS — BP 171/77 | HR 77 | Temp 98.3°F | Ht 63.0 in | Wt 199.0 lb

## 2024-09-06 DIAGNOSIS — I10 Essential (primary) hypertension: Secondary | ICD-10-CM | POA: Diagnosis not present

## 2024-09-06 DIAGNOSIS — F419 Anxiety disorder, unspecified: Secondary | ICD-10-CM

## 2024-09-06 DIAGNOSIS — Z6835 Body mass index (BMI) 35.0-35.9, adult: Secondary | ICD-10-CM | POA: Diagnosis not present

## 2024-09-06 DIAGNOSIS — R7401 Elevation of levels of liver transaminase levels: Secondary | ICD-10-CM

## 2024-09-06 DIAGNOSIS — E279 Disorder of adrenal gland, unspecified: Secondary | ICD-10-CM

## 2024-09-06 DIAGNOSIS — E0590 Thyrotoxicosis, unspecified without thyrotoxic crisis or storm: Secondary | ICD-10-CM

## 2024-09-06 DIAGNOSIS — R399 Unspecified symptoms and signs involving the genitourinary system: Secondary | ICD-10-CM

## 2024-09-06 DIAGNOSIS — R5383 Other fatigue: Secondary | ICD-10-CM | POA: Insufficient documentation

## 2024-09-06 DIAGNOSIS — Z7689 Persons encountering health services in other specified circumstances: Secondary | ICD-10-CM

## 2024-09-06 MED ORDER — HYDROXYZINE HCL 10 MG PO TABS: 5.0000 mg | ORAL_TABLET | Freq: Three times a day (TID) | ORAL | 0 refills | Status: DC | PRN

## 2024-09-06 MED ORDER — AMITRIPTYLINE HCL 100 MG PO TABS: 100.0000 mg | ORAL_TABLET | Freq: Every day | ORAL | 1 refills | Status: AC

## 2024-09-06 MED ORDER — AMLODIPINE-OLMESARTAN 5-20 MG PO TABS: 1.0000 | ORAL_TABLET | Freq: Every day | ORAL | 1 refills | Status: DC

## 2024-09-06 NOTE — Patient Instructions (Signed)

## 2024-09-06 NOTE — Progress Notes (Signed)
 Establish Care Note  BP (!) 171/77   Pulse 77   Temp 98.3 F (36.8 C) (Oral)   Ht 5' 3 (1.6 m)   Wt 199 lb (90.3 kg)   SpO2 96%   BMI 35.25 kg/m    Subjective:    Patient ID: Joyce [name], [name]    DOB: June 28, 1966, 58 y.o.   MRN: [id number]  HPI: Joyce [name] is a 58 y.o. [name]  Chief Complaint  Patient presents with   Establish Care    Blood pressure     Establishing care, the following was discussed today:  Discussed the use of AI scribe software for clinical note transcription with the patient, who gave verbal consent to proceed.  History of Present Illness   Joyce [name] is a 58 year old [name] who presents with concerns about her blood pressure management and multiple elevated lab results.  She has been off her blood pressure medications and is unsure of their effectiveness, as she does not feel any different whether her blood pressure is high or not. She does not experience symptoms typically associated with high blood pressure, except for swelling in her ankles.  She mentions having high cortisol levels, high cholesterol, high triglycerides, and elevated liver enzymes from previous tests conducted in June by her endocrinologist. She also notes a history of an adrenal nodule. She has not completed a 24-hour urine test yet and feels overwhelmed by the number of elevated lab results.  She is experiencing urinary incontinence, describing it as 'dribbling out' when she stands up, which has been occurring for the past two months. She is not currently taking any medication for this issue and is unsure if it is related to a urinary tract infection.        Current Outpatient Medications on File Prior to Visit  Medication Sig Dispense Refill   albuterol (VENTOLIN HFA) 108 (90 Base) MCG/ACT inhaler Inhale into the lungs.     amitriptyline (ELAVIL) 50 MG tablet Take 50 mg by mouth at bedtime.     celecoxib (CELEBREX) 200 MG capsule 1 po bid prn pain     No current  facility-administered medications on file prior to visit.    #HM Will review HM records and updated as needed.  Relevant past medical, surgical, family and social history reviewed and updated as indicated. Interim medical history since our last visit reviewed. Allergies and medications reviewed and updated.  ROS per HPI unless specifically indicated above     Objective:    BP (!) 171/77   Pulse 77   Temp 98.3 F (36.8 C) (Oral)   Ht 5' 3 (1.6 m)   Wt 199 lb (90.3 kg)   SpO2 96%   BMI 35.25 kg/m   Wt Readings from Last 3 Encounters:  09/06/24 199 lb (90.3 kg)  10/14/23 199 lb (90.3 kg)  05/27/19 165 lb (74.8 kg)     Physical Exam Constitutional:      Appearance: Normal appearance.  Pulmonary:     Effort: Pulmonary effort is normal.  Musculoskeletal:        General: Normal range of motion.  Skin:    Comments: Normal skin color  Neurological:     General: No focal deficit present.     Mental Status: She is alert. Mental status is at baseline.  Psychiatric:        Mood and Affect: Mood normal.        Behavior: Behavior normal.  Thought Content: Thought content normal.         09/06/2024    4:15 PM  Depression screen PHQ 2/9  Decreased Interest 1  Down, Depressed, Hopeless 0  PHQ - 2 Score 1  Altered sleeping 3  Tired, decreased energy 3  Change in appetite 0  Feeling bad or failure about yourself  0  Trouble concentrating 0  Moving slowly or fidgety/restless 0  Suicidal thoughts 0  PHQ-9 Score 7  Difficult doing work/chores Somewhat difficult        09/06/2024    4:15 PM  GAD 7 : Generalized Anxiety Score  Nervous, Anxious, on Edge 2  Control/stop worrying 2  Worry too much - different things 2  Trouble relaxing 2  Restless 1  Easily annoyed or irritable 2  Afraid - awful might happen 0  Total GAD 7 Score 11  Anxiety Difficulty Somewhat difficult       Assessment & Plan:  Assessment & Plan   Primary hypertension Assessment &  Plan: Management suboptimal due to medication non-adherence. Asymptomatic but requires control to prevent complications. Has been out of medication for 2 days.  - Refill current hypertension medication. Azor 5-20mg . - Monitor blood pressure readings.  Orders: -     amLODIPine-Olmesartan; Take 1 tablet by mouth daily.  Dispense: 30 tablet; Refill: 1  Subclinical hyperthyroidism Assessment & Plan: Following w endo. Due for repeat.  Orders: -     Thyroid Panel With TSH -     Thyroid Panel With TSH  BMI 35.0-35.9,adult Assessment & Plan: DM screen.  Orders: -     Bayer DCA Hb A1c Waived  Anxiety -     Amitriptyline HCl; Take 1 tablet (100 mg total) by mouth at bedtime.  Dispense: 90 tablet; Refill: 1 -     hydrOXYzine HCl; Take 0.5-1 tablets (5-10 mg total) by mouth 3 (three) times daily as needed.  Dispense: 30 tablet; Refill: 0  Adrenal nodule Assessment & Plan: Adrenal nodule with elevated cortisol levels requires further evaluation. - Review previous endocrinology test results.   Transaminitis Elevated alkaline phosphatase requires further evaluation. - Order blood work to assess liver function. -     Hemoglobin A1c -     Comprehensive metabolic panel with GFR -     Lipid panel -     CBC with Differential/Platelet -     Bayer DCA Hb A1c Waived -     CBC  Urinary tract infection symptoms Subacute, month long urinary frequency and incontinence. Will r/o infection but may benefit from urogyn referral. -     Urinalysis, Routine w reflex microscopic -     Urine Culture -     Microscopic Examination  Encounter to establish care Reviewed available patient record including history, medications, problem list. HM updated as able. Will review and/or request outside records (if applicable) and will fill remaining HM gaps as needed at follow up visit.   Follow up plan: Return in about 3 weeks (around 09/27/2024) for HTN.  Hadassah SHAUNNA Nett, MD

## 2024-09-07 LAB — URINALYSIS, ROUTINE W REFLEX MICROSCOPIC
Glucose, UA: NEGATIVE
Ketones, UA: NEGATIVE
Leukocytes,UA: NEGATIVE
Nitrite, UA: NEGATIVE
RBC, UA: NEGATIVE
Specific Gravity, UA: 1.025 (ref 1.005–1.030)
Urobilinogen, Ur: 1 mg/dL (ref 0.2–1.0)
pH, UA: 5.5 (ref 5.0–7.5)

## 2024-09-07 LAB — LIPID PANEL
Chol/HDL Ratio: 5.6 ratio — ABNORMAL HIGH (ref 0.0–4.4)
Cholesterol, Total: 212 mg/dL — ABNORMAL HIGH (ref 100–199)
HDL: 38 mg/dL — ABNORMAL LOW (ref >39)
LDL Chol Calc (NIH): 147 mg/dL — ABNORMAL HIGH (ref 0–99)
Triglycerides: 149 mg/dL (ref 0–149)
VLDL Cholesterol Cal: 27 mg/dL (ref 5–40)

## 2024-09-07 LAB — CBC
Hematocrit: 40 % (ref 34.0–46.6)
Hemoglobin: 13.7 g/dL (ref 11.1–15.9)
MCH: 29.1 pg (ref 26.6–33.0)
MCHC: 34.3 g/dL (ref 31.5–35.7)
MCV: 85 fL (ref 79–97)
Platelets: 317 x10E3/uL (ref 150–450)
RBC: 4.7 x10E6/uL (ref 3.77–5.28)
RDW: 15.4 % (ref 11.7–15.4)
WBC: 9.9 x10E3/uL (ref 3.4–10.8)

## 2024-09-07 LAB — COMPREHENSIVE METABOLIC PANEL WITH GFR
ALT: 27 IU/L (ref 0–32)
AST: 17 IU/L (ref 0–40)
Albumin: 4.5 g/dL (ref 3.8–4.9)
Alkaline Phosphatase: 122 IU/L (ref 49–135)
BUN/Creatinine Ratio: 17 (ref 9–23)
BUN: 14 mg/dL (ref 6–24)
Bilirubin Total: <0.2 mg/dL (ref 0.0–1.2)
CO2: 21 mmol/L (ref 20–29)
Calcium: 9.2 mg/dL (ref 8.7–10.2)
Chloride: 102 mmol/L (ref 96–106)
Creatinine, Ser: 0.82 mg/dL (ref 0.57–1.00)
Globulin, Total: 2.3 g/dL (ref 1.5–4.5)
Glucose: 91 mg/dL (ref 70–99)
Potassium: 3.7 mmol/L (ref 3.5–5.2)
Sodium: 140 mmol/L (ref 134–144)
Total Protein: 6.8 g/dL (ref 6.0–8.5)
eGFR: 83 mL/min/1.73 (ref >59)

## 2024-09-07 LAB — MICROSCOPIC EXAMINATION
Bacteria, UA: NONE SEEN
RBC, Urine: NONE SEEN /HPF (ref 0–2)
WBC, UA: NONE SEEN /HPF (ref 0–5)

## 2024-09-07 LAB — THYROID PANEL WITH TSH
Free Thyroxine Index: 2 (ref 1.2–4.9)
T3 Uptake Ratio: 30 % (ref 24–39)
T4, Total: 6.6 ug/dL (ref 4.5–12.0)
TSH: 0.205 u[IU]/mL — ABNORMAL LOW (ref 0.450–4.500)

## 2024-09-07 LAB — BAYER DCA HB A1C WAIVED: HB A1C (BAYER DCA - WAIVED): 5.9 % — ABNORMAL HIGH (ref 4.8–5.6)

## 2024-09-08 ENCOUNTER — Ambulatory Visit: Payer: Self-pay | Admitting: Pediatrics

## 2024-09-08 LAB — URINE CULTURE

## 2024-09-12 ENCOUNTER — Other Ambulatory Visit: Payer: Self-pay | Admitting: Pediatrics

## 2024-09-12 DIAGNOSIS — E785 Hyperlipidemia, unspecified: Secondary | ICD-10-CM

## 2024-09-12 MED ORDER — ATORVASTATIN CALCIUM 10 MG PO TABS: 10.0000 mg | ORAL_TABLET | Freq: Every day | ORAL | 3 refills | Status: AC

## 2024-09-12 NOTE — Progress Notes (Signed)
 The 10-year ASCVD risk score (Arnett DK, et al., 2019) is: 9%   Values used to calculate the score:     Age: 58 years     Clincally relevant sex: Female     Is Non-Hispanic African American: No     Diabetic: No     Tobacco smoker: Yes     Systolic Blood Pressure: 118 mmHg     Is BP treated: Yes     HDL Cholesterol: 38 mg/dL     Total Cholesterol: 212 mg/dL  Plan to start atorva 10mg   Hadassah SHAUNNA Nett, MD

## 2024-09-13 ENCOUNTER — Encounter: Payer: Self-pay | Admitting: Pediatrics

## 2024-09-13 DIAGNOSIS — I10 Essential (primary) hypertension: Secondary | ICD-10-CM | POA: Insufficient documentation

## 2024-09-13 DIAGNOSIS — Z6835 Body mass index (BMI) 35.0-35.9, adult: Secondary | ICD-10-CM | POA: Insufficient documentation

## 2024-09-13 DIAGNOSIS — F419 Anxiety disorder, unspecified: Secondary | ICD-10-CM | POA: Insufficient documentation

## 2024-09-13 NOTE — Assessment & Plan Note (Signed)
 Adrenal nodule with elevated cortisol levels requires further evaluation. - Review previous endocrinology test results.

## 2024-09-13 NOTE — Addendum Note (Signed)
 Addended by: HEROLD HADASSAH SQUIBB on: 09/13/2024 08:00 AM   Modules accepted: Orders

## 2024-09-13 NOTE — Assessment & Plan Note (Signed)
 Management suboptimal due to medication non-adherence. Asymptomatic but requires control to prevent complications. Has been out of medication for 2 days.  - Refill current hypertension medication. Azor 5-20mg . - Monitor blood pressure readings.

## 2024-09-13 NOTE — Assessment & Plan Note (Signed)
 Following w endo. Due for repeat.

## 2024-09-13 NOTE — Assessment & Plan Note (Signed)
 DM screen

## 2024-09-27 ENCOUNTER — Encounter: Payer: Self-pay | Admitting: Pediatrics

## 2024-09-27 ENCOUNTER — Ambulatory Visit: Admitting: Pediatrics

## 2024-09-27 ENCOUNTER — Ambulatory Visit: Payer: Self-pay | Admitting: Pediatrics

## 2024-09-27 VITALS — BP 144/72 | HR 80 | Temp 98.4°F | Resp 15 | Ht 62.99 in | Wt 198.2 lb

## 2024-09-27 DIAGNOSIS — Z1231 Encounter for screening mammogram for malignant neoplasm of breast: Secondary | ICD-10-CM | POA: Diagnosis not present

## 2024-09-27 DIAGNOSIS — N949 Unspecified condition associated with female genital organs and menstrual cycle: Secondary | ICD-10-CM

## 2024-09-27 DIAGNOSIS — F17200 Nicotine dependence, unspecified, uncomplicated: Secondary | ICD-10-CM | POA: Diagnosis not present

## 2024-09-27 DIAGNOSIS — I10 Essential (primary) hypertension: Secondary | ICD-10-CM

## 2024-09-27 DIAGNOSIS — Z1211 Encounter for screening for malignant neoplasm of colon: Secondary | ICD-10-CM | POA: Diagnosis not present

## 2024-09-27 LAB — WET PREP FOR TRICH, YEAST, CLUE
Clue Cell Exam: NEGATIVE
Trichomonas Exam: NEGATIVE
Yeast Exam: NEGATIVE

## 2024-09-27 MED ORDER — AMLODIPINE-OLMESARTAN 5-40 MG PO TABS: 1.0000 | ORAL_TABLET | Freq: Every day | ORAL | 1 refills | Status: DC

## 2024-09-27 NOTE — Patient Instructions (Signed)
 Increase azor 5-20mg 

## 2024-09-27 NOTE — Progress Notes (Signed)
 Office Visit  BP (!) 144/72 (BP Location: Left Arm, Patient Position: Sitting, Cuff Size: Large)   Pulse 80   Temp 98.4 F (36.9 C) (Oral)   Resp 15   Ht 5' 2.99 (1.6 m)   Wt 198 lb 3.2 oz (89.9 kg)   SpO2 98%   BMI 35.12 kg/m    Subjective:    Patient ID: Joyce [name], female    DOB: May 27, 1966, 58 y.o.   MRN: [id number]  HPI: Joyce [name] is a 58 y.o. female  Chief Complaint  Patient presents with   Hypertension    Discussed the use of AI scribe software for clinical note transcription with the patient, who gave verbal consent to proceed.  History of Present Illness   Joyce [name] is a 58 year old female with hypertension who presents for medication adjustment and reports a new brownish discharge.  She is here for a follow-up regarding her hypertension. She is currently on a 5/20 mg mix of her blood pressure medication.  She reports a new concern of a brownish discharge, which she describes as having no odor and no associated itchiness or other symptoms. She has not been sexually active for four years and is unsure of the cause. She noticed the discharge on her women's pads, which she uses due to bladder leakage.  No headaches, chest pain, or shortness of breath.        Relevant past medical, surgical, family and social history reviewed and updated as indicated. Interim medical history since our last visit reviewed. Allergies and medications reviewed and updated.  ROS per HPI unless specifically indicated above     Objective:    BP (!) 144/72 (BP Location: Left Arm, Patient Position: Sitting, Cuff Size: Large)   Pulse 80   Temp 98.4 F (36.9 C) (Oral)   Resp 15   Ht 5' 2.99 (1.6 m)   Wt 198 lb 3.2 oz (89.9 kg)   SpO2 98%   BMI 35.12 kg/m   Wt Readings from Last 3 Encounters:  09/27/24 198 lb 3.2 oz (89.9 kg)  09/06/24 199 lb (90.3 kg)  10/14/23 199 lb (90.3 kg)     Physical Exam Constitutional:      Appearance: Normal appearance.   Pulmonary:     Effort: Pulmonary effort is normal.  Musculoskeletal:        General: Normal range of motion.  Skin:    Comments: Normal skin color  Neurological:     General: No focal deficit present.     Mental Status: She is alert. Mental status is at baseline.  Psychiatric:        Mood and Affect: Mood normal.        Behavior: Behavior normal.        Thought Content: Thought content normal.         09/27/2024    3:28 PM 09/06/2024    4:15 PM  Depression screen PHQ 2/9  Decreased Interest 0 1  Down, Depressed, Hopeless 0 0  PHQ - 2 Score 0 1  Altered sleeping 3 3  Tired, decreased energy 3 3  Change in appetite 0 0  Feeling bad or failure about yourself  0 0  Trouble concentrating 0 0  Moving slowly or fidgety/restless 0 0  Suicidal thoughts 0 0  PHQ-9 Score 6  7   Difficult doing work/chores Somewhat difficult Somewhat difficult     Data saved with a previous flowsheet row definition  09/27/2024    3:28 PM 09/06/2024    4:15 PM  GAD 7 : Generalized Anxiety Score  Nervous, Anxious, on Edge 2 2  Control/stop worrying 2 2  Worry too much - different things 2 2  Trouble relaxing 2 2  Restless 0 1  Easily annoyed or irritable 2 2  Afraid - awful might happen 2 0  Total GAD 7 Score 12 11  Anxiety Difficulty Somewhat difficult Somewhat difficult       Assessment & Plan:  Assessment & Plan   Primary hypertension Assessment & Plan: Blood pressure slightly elevated despite current medication. No symptoms reported. - Increase medication to azor 5/40 mg.  Orders: -     amLODIPine-Olmesartan; Take 1 tablet by mouth daily.  Dispense: 30 tablet; Refill: 1  Screening for colon cancer -     Cologuard  Encounter for screening mammogram for malignant neoplasm of breast -     3D Screening Mammogram, Left and Right; Future  Current every day smoker -     Ambulatory Referral for Lung Cancer Scre  Vaginal discomfort Brownish discharge without odor or  itchiness.  - Perform self-swab test at lab to rule out infections. -     WET PREP FOR TRICH, YEAST, CLUE   Follow up plan: Return in about 4 weeks (around 10/25/2024) for HTN.  Hadassah SHAUNNA Nett, MD

## 2024-10-04 ENCOUNTER — Encounter: Payer: Self-pay | Admitting: Pediatrics

## 2024-10-04 NOTE — Assessment & Plan Note (Signed)
 Blood pressure slightly elevated despite current medication. No symptoms reported. - Increase medication to azor 5/40 mg.

## 2024-10-23 ENCOUNTER — Ambulatory Visit
Admission: RE | Admit: 2024-10-23 | Discharge: 2024-10-23 | Disposition: A | Source: Ambulatory Visit | Attending: Pediatrics | Admitting: Pediatrics

## 2024-10-23 DIAGNOSIS — Z1231 Encounter for screening mammogram for malignant neoplasm of breast: Secondary | ICD-10-CM

## 2024-10-29 ENCOUNTER — Other Ambulatory Visit: Payer: Self-pay | Admitting: Pediatrics

## 2024-10-29 DIAGNOSIS — F419 Anxiety disorder, unspecified: Secondary | ICD-10-CM

## 2024-10-31 ENCOUNTER — Ambulatory Visit: Admitting: Pediatrics

## 2024-10-31 NOTE — Telephone Encounter (Signed)
 Requested medication (s) are due for refill today:Yes  Requested medication (s) are on the active medication list:Yes  Last refill: 09/08/2024  Future visit if scheduled: 11/22/2024  Requested Renewals   Name from pharmacy: hydrOXYzine  HCL 10 mg tablet (ATARAX )       Will file in chart as: hydrOXYzine  (ATARAX ) 10 MG tablet   Sig: Take 0.5-1 tablets (5-10 mg total) by mouth 3 (three) times daily as needed.   Disp: 30 tablet    Refills: 0   Start: 10/29/2024   Class: Normal   Non-formulary For: Anxiety   Last ordered: 1 month ago (09/06/2024) by Hadassah SHAUNNA Nett, MD   Last refill: 09/08/2024   Rx #: [id number]   Ear, Nose, and Throat:  Antihistamines 2 Passed12/05/2024 05:28 PM  Protocol Details Cr in normal range and within 360 days   Valid encounter within last 12 months    To be filled at: [facility] - [place],  - [address]

## 2024-11-22 ENCOUNTER — Ambulatory Visit: Admitting: Pediatrics

## 2024-12-08 ENCOUNTER — Other Ambulatory Visit: Payer: Self-pay | Admitting: Pediatrics

## 2024-12-08 DIAGNOSIS — F419 Anxiety disorder, unspecified: Secondary | ICD-10-CM

## 2024-12-08 DIAGNOSIS — I10 Essential (primary) hypertension: Secondary | ICD-10-CM

## 2024-12-08 NOTE — Telephone Encounter (Signed)
 Copied from CRM [id number]. Topic: Clinical - Medication Refill >> Dec 08, 2024 11:51 AM Gattis SQUIBB wrote: Medication:  Hydroxyzine  10 mg Amlodipine  5-40  Has the patient contacted their pharmacy? Yes (Agent: If no, request that the patient contact the pharmacy for the refill. If patient does not wish to contact the pharmacy document the reason why and proceed with request.) (Agent: If yes, when and what did the pharmacy advise?)  This is the patient's preferred pharmacy:  [facility] - [place]ters, [place] - [address] [place] [address] Phone: [phone] Fax: [phone]  Is this the correct pharmacy for this prescription? Yes If no, delete pharmacy and type the correct one.   Has the prescription been filled recently? Yes  Is the patient out of the medication? Yes  Has the patient been seen for an appointment in the last year OR does the patient have an upcoming appointment? Yes  Can we respond through MyChart? Yes  Agent: Please be advised that Rx refills may take up to 3 business days. We ask that you follow-up with your pharmacy.

## 2024-12-08 NOTE — Telephone Encounter (Signed)
 Requested medications are due for refill today.  yes   Requested medications are on the active medications list.  yes   Last refill. varied   Future visit scheduled.   no   Notes to clinic.  Dr. Herold is pcp and signed rxs.   Requested Prescriptions  Pending Prescriptions Disp Refills   hydrOXYzine  (ATARAX ) 10 MG tablet 30 tablet 0    Sig: Take 0.5-1 tablets (5-10 mg total) by mouth 3 (three) times daily as needed.     Ear, Nose, and Throat:  Antihistamines 2 Passed - 12/08/2024  2:09 PM      Passed - Cr in normal range and within 360 days    Creatinine, Ser  Date Value Ref Range Status  09/06/2024 0.82 0.57 - 1.00 mg/dL Final         Passed - Valid encounter within last 12 months    Recent Outpatient Visits           2 months ago Primary hypertension   [place] [facility] Herold Hadassah SQUIBB, MD   3 months ago Primary hypertension   [facility] Herold Hadassah SQUIBB, MD               amLODipine -olmesartan  (AZOR ) 5-40 MG tablet 30 tablet 1    Sig: Take 1 tablet by mouth daily.     Cardiovascular: CCB + ARB Combos Failed - 12/08/2024  2:09 PM      Failed - Last BP in normal range    BP Readings from Last 1 Encounters:  09/27/24 (!) 144/72         Passed - K in normal range and within 180 days    Potassium  Date Value Ref Range Status  09/06/2024 3.7 3.5 - 5.2 mmol/L Final         Passed - Cr in normal range and within 180 days    Creatinine, Ser  Date Value Ref Range Status  09/06/2024 0.82 0.57 - 1.00 mg/dL Final         Passed - Na in normal range and within 180 days    Sodium  Date Value Ref Range Status  09/06/2024 140 134 - 144 mmol/L Final         Passed - Patient is not pregnant      Passed - Valid encounter within last 6 months    Recent Outpatient Visits           2 months ago Primary hypertension   [place] [facility] Herold Hadassah SQUIBB, MD   3 months ago Primary hypertension   [facility] Herold Hadassah SQUIBB, MD

## 2024-12-08 NOTE — Telephone Encounter (Signed)
 [facility] Refill Encounter  Last office visit: 09/27/24  Verified last visit within 6 months and next visit is scheduled: Yes   Next office visit: 02/15/2025   Patient record reviewed by CMA and medication due for refill: Yes  TOC from Dr. Herold  Requested Prescriptions   Pending Prescriptions Disp Refills   hydrOXYzine  (ATARAX ) 10 MG tablet 30 tablet 0    Sig: Take 0.5-1 tablets (5-10 mg total) by mouth 3 (three) times daily as needed.   amLODipine -olmesartan  (AZOR ) 5-40 MG tablet 30 tablet 1    Sig: Take 1 tablet by mouth daily.

## 2024-12-08 NOTE — Telephone Encounter (Signed)
 Requested medications are due for refill today.  yes  Requested medications are on the active medications list.  yes  Last refill. varied  Future visit scheduled.   no  Notes to clinic.  Dr. Herold is pcp and signed rxs.    Requested Prescriptions  Pending Prescriptions Disp Refills   hydrOXYzine  (ATARAX ) 10 MG tablet 30 tablet 0    Sig: Take 0.5-1 tablets (5-10 mg total) by mouth 3 (three) times daily as needed.     Ear, Nose, and Throat:  Antihistamines 2 Passed - 12/08/2024  2:08 PM      Passed - Cr in normal range and within 360 days    Creatinine, Ser  Date Value Ref Range Status  09/06/2024 0.82 0.57 - 1.00 mg/dL Final         Passed - Valid encounter within last 12 months    Recent Outpatient Visits           2 months ago Primary hypertension   [place] [facility] Herold Hadassah SQUIBB, MD   3 months ago Primary hypertension   [place] [facility] Herold Hadassah SQUIBB, MD               amLODipine -olmesartan  (AZOR ) 5-40 MG tablet 30 tablet 1    Sig: Take 1 tablet by mouth daily.     Cardiovascular: CCB + ARB Combos Failed - 12/08/2024  2:08 PM      Failed - Last BP in normal range    BP Readings from Last 1 Encounters:  09/27/24 (!) 144/72         Passed - K in normal range and within 180 days    Potassium  Date Value Ref Range Status  09/06/2024 3.7 3.5 - 5.2 mmol/L Final         Passed - Cr in normal range and within 180 days    Creatinine, Ser  Date Value Ref Range Status  09/06/2024 0.82 0.57 - 1.00 mg/dL Final         Passed - Na in normal range and within 180 days    Sodium  Date Value Ref Range Status  09/06/2024 140 134 - 144 mmol/L Final         Passed - Patient is not pregnant      Passed - Valid encounter within last 6 months    Recent Outpatient Visits           2 months ago Primary hypertension   [place] [facility] Herold Hadassah SQUIBB, MD   3 months ago Primary hypertension   [place]  [facility] Herold Hadassah SQUIBB, MD

## 2024-12-11 MED ORDER — AMLODIPINE-OLMESARTAN 5-40 MG PO TABS: 1.0000 | ORAL_TABLET | Freq: Every day | ORAL | 0 refills | Status: AC

## 2024-12-11 MED ORDER — HYDROXYZINE HCL 10 MG PO TABS: 5.0000 mg | ORAL_TABLET | Freq: Three times a day (TID) | ORAL | 0 refills | Status: AC | PRN

## 2025-02-15 ENCOUNTER — Encounter: Admitting: Nurse Practitioner
